# Patient Record
Sex: Female | Born: 1990 | Hispanic: No | Marital: Married | Smoking: Never smoker
Health system: Southern US, Community
[De-identification: ages and names within clinical notes are randomized; demographics above are authoritative.]

## PROBLEM LIST (undated history)

## (undated) DIAGNOSIS — Z789 Other specified health status: Secondary | ICD-10-CM

## (undated) DIAGNOSIS — O1490 Unspecified pre-eclampsia, unspecified trimester: Secondary | ICD-10-CM

## (undated) HISTORY — DX: Unspecified pre-eclampsia, unspecified trimester: O14.90

## (undated) HISTORY — DX: Other specified health status: Z78.9

---

## 2018-11-07 ENCOUNTER — Other Ambulatory Visit: Payer: Self-pay

## 2018-11-07 ENCOUNTER — Ambulatory Visit: Payer: Self-pay | Admitting: Internal Medicine

## 2018-11-07 DIAGNOSIS — N898 Other specified noninflammatory disorders of vagina: Secondary | ICD-10-CM

## 2018-11-07 NOTE — Progress Notes (Signed)
No charge visit. Patient is coming in tomorrow for a live visit to evaluate the same complaint.  Peggye Pitt, MD

## 2018-11-08 ENCOUNTER — Other Ambulatory Visit: Payer: Self-pay

## 2018-11-08 ENCOUNTER — Ambulatory Visit: Payer: Self-pay | Admitting: Internal Medicine

## 2018-11-08 ENCOUNTER — Encounter: Payer: Self-pay | Admitting: Internal Medicine

## 2018-11-08 ENCOUNTER — Ambulatory Visit (INDEPENDENT_AMBULATORY_CARE_PROVIDER_SITE_OTHER): Payer: Self-pay | Admitting: Internal Medicine

## 2018-11-08 ENCOUNTER — Other Ambulatory Visit (HOSPITAL_COMMUNITY)
Admission: RE | Admit: 2018-11-08 | Discharge: 2018-11-08 | Disposition: A | Discharge status: Home / Self Care | Payer: Self-pay | Source: Ambulatory Visit | Attending: Internal Medicine | Admitting: Internal Medicine

## 2018-11-08 VITALS — BP 102/68 | HR 106 | Temp 98.6°F | Ht 59.0 in | Wt 131.2 lb

## 2018-11-08 DIAGNOSIS — N898 Other specified noninflammatory disorders of vagina: Secondary | ICD-10-CM

## 2018-11-08 DIAGNOSIS — Z0000 Encounter for general adult medical examination without abnormal findings: Secondary | ICD-10-CM | POA: Insufficient documentation

## 2018-11-08 DIAGNOSIS — R221 Localized swelling, mass and lump, neck: Secondary | ICD-10-CM

## 2018-11-08 LAB — POCT URINE PREGNANCY: Preg Test, Ur: NEGATIVE

## 2018-11-08 NOTE — Patient Instructions (Signed)
-It nice meeting you today!  -Lab work today. Will notify you when results are available.   Preventive Care 18-39 Years, Female Preventive care refers to lifestyle choices and visits with your health care provider that can promote health and wellness. What does preventive care include?   A yearly physical exam. This is also called an annual well check.  Dental exams once or twice a year.  Routine eye exams. Ask your health care provider how often you should have your eyes checked.  Personal lifestyle choices, including: ? Daily care of your teeth and gums. ? Regular physical activity. ? Eating a healthy diet. ? Avoiding tobacco and drug use. ? Limiting alcohol use. ? Practicing safe sex. ? Taking vitamin and mineral supplements as recommended by your health care provider. What happens during an annual well check? The services and screenings done by your health care provider during your annual well check will depend on your age, overall health, lifestyle risk factors, and family history of disease. Counseling Your health care provider may ask you questions about your:  Alcohol use.  Tobacco use.  Drug use.  Emotional well-being.  Home and relationship well-being.  Sexual activity.  Eating habits.  Work and work Statistician.  Method of birth control.  Menstrual cycle.  Pregnancy history. Screening You may have the following tests or measurements:  Height, weight, and BMI.  Diabetes screening. This is done by checking your blood sugar (glucose) after you have not eaten for a while (fasting).  Blood pressure.  Lipid and cholesterol levels. These may be checked every 5 years starting at age 63.  Skin check.  Hepatitis C blood test.  Hepatitis B blood test.  Sexually transmitted disease (STD) testing.  BRCA-related cancer screening. This may be done if you have a family history of breast, ovarian, tubal, or peritoneal cancers.  Pelvic exam and Pap  test. This may be done every 3 years starting at age 20. Starting at age 67, this may be done every 5 years if you have a Pap test in combination with an HPV test. Discuss your test results, treatment options, and if necessary, the need for more tests with your health care provider. Vaccines Your health care provider may recommend certain vaccines, such as:  Influenza vaccine. This is recommended every year.  Tetanus, diphtheria, and acellular pertussis (Tdap, Td) vaccine. You may need a Td booster every 10 years.  Varicella vaccine. You may need this if you have not been vaccinated.  HPV vaccine. If you are 89 or younger, you may need three doses over 6 months.  Measles, mumps, and rubella (MMR) vaccine. You may need at least one dose of MMR. You may also need a second dose.  Pneumococcal 13-valent conjugate (PCV13) vaccine. You may need this if you have certain conditions and were not previously vaccinated.  Pneumococcal polysaccharide (PPSV23) vaccine. You may need one or two doses if you smoke cigarettes or if you have certain conditions.  Meningococcal vaccine. One dose is recommended if you are age 71-21 years and a first-year college student living in a residence hall, or if you have one of several medical conditions. You may also need additional booster doses.  Hepatitis A vaccine. You may need this if you have certain conditions or if you travel or work in places where you may be exposed to hepatitis A.  Hepatitis B vaccine. You may need this if you have certain conditions or if you travel or work in places where you may  be exposed to hepatitis B.  Haemophilus influenzae type b (Hib) vaccine. You may need this if you have certain risk factors. Talk to your health care provider about which screenings and vaccines you need and how often you need them. This information is not intended to replace advice given to you by your health care provider. Make sure you discuss any questions you  have with your health care provider. Document Released: 08/31/2001 Document Revised: 02/15/2017 Document Reviewed: 05/06/2015 Elsevier Interactive Patient Education  2019 [employer].

## 2018-11-08 NOTE — Progress Notes (Signed)
New Patient Office Visit     CC/Reason for Visit: Establish care, vaginal itching Previous PCP: none Last Visit: unknown  HPI: Heather Mcclure is a 28 y.o. female who is coming in today for the above mentioned reasons. She moved to the [place] from [place] 3 years ago. She has not had routine medical care. She has stopped using all birth control and is now having unprotected intercourse with her husband as she is trying to have a child. Her menstrual cycles are regular but have become less long, now lasting 2-3 days as opposed to 4-5 before. The vaginal itching began 2 weeks ago, she tried using OTC Vagisil without relief. She has not noticed any vaginal discharge, does not believe she is pregnant.  She is due for tetanus immunization.  She does not have routine eye or dental care.  She is due for PAP today.   Past Medical/Surgical History: History reviewed. No pertinent past medical history.  History reviewed. No pertinent surgical history.  Social History:  reports that she has never smoked. She has never used smokeless tobacco. She reports current alcohol use. She reports that she does not use drugs.  Allergies: No Known Allergies  Family History:  Family History  Problem Relation Age of Onset  . Heart disease Father     No current outpatient medications on file.  Review of Systems:  Constitutional: Denies fever, chills, diaphoresis, appetite change and fatigue.  HEENT: Denies photophobia, eye pain, redness, hearing loss, ear pain, congestion, sore throat, rhinorrhea, sneezing, mouth sores, trouble swallowing, neck pain, neck stiffness and tinnitus.   Respiratory: Denies SOB, DOE, cough, chest tightness,  and wheezing.   Cardiovascular: Denies chest pain, palpitations and leg swelling.  Gastrointestinal: Denies nausea, vomiting, abdominal pain, diarrhea, constipation, blood in stool and abdominal distention.  Genitourinary: Denies dysuria, urgency, frequency, hematuria, flank  pain and difficulty urinating.  Endocrine: Denies: hot or cold intolerance, sweats, changes in hair or nails, polyuria, polydipsia. Musculoskeletal: Denies myalgias, back pain, joint swelling, arthralgias and gait problem.  Skin: Denies pallor, rash and wound.  Neurological: Denies dizziness, seizures, syncope, weakness, light-headedness, numbness and headaches.  Hematological: Denies adenopathy. Easy bruising, personal or family bleeding history  Psychiatric/Behavioral: Denies suicidal ideation, mood changes, confusion, nervousness, sleep disturbance and agitation    Physical Exam: Vitals:   11/08/18 1524  BP: 102/68  Pulse: (!) 106  Temp: 98.6 F (37 C)  TempSrc: Oral  SpO2: 95%  Weight: 131 lb 3.2 oz (59.5 kg)  Height: _0  (1.499 m)   Body mass index is 26.5 kg/m.   Constitutional: NAD, calm, comfortable Eyes: PERRL, lids and conjunctivae normal ENMT: Mucous membranes are moist. Posterior pharynx clear of any exudate or lesions. Normal dentition. Tympanic membrane is pearly white, no erythema or bulging. Neck: normal, supple, significant neck fullness Respiratory: clear to auscultation bilaterally, no wheezing, no crackles. Normal respiratory effort. No accessory muscle use.  Cardiovascular: Regular rate and rhythm, no murmurs / rubs / gallops. No extremity edema. 2+ pedal pulses. No carotid bruits.  Abdomen: no tenderness, no masses palpated. No hepatosplenomegaly. Bowel sounds positive.  Musculoskeletal: no clubbing / cyanosis. No joint deformity upper and lower extremities. Good ROM, no contractures. Normal muscle tone.  Neurologic: CN 2-12 grossly intact. Sensation intact, DTR normal. Strength 5/5 in all 4.  Psychiatric: Normal judgment and insight. Alert and oriented x 3. Normal mood.    Impression and Plan:  Encounter for preventive health examination  -Refuses tetanus immunization today. -Advised routine  eye and dental care. -PAP performed today. -Discussed  healthy eating habits and increased physical activity.  Neck fullness  -Concerned for thyroid enlargement, especially as she is trying to conceive. -Check TSH, free T4 and free T3 today. -May need thyroid US.  Vaginal itching -Pelvic performed today, mild-moderate amounts of clear discharge noted, no redness. -Wet prep sent.     Patient Instructions  -It nice meeting you today!  -Lab work today. Will notify you when results are available.   Preventive Care 18-39 Years, Female Preventive care refers to lifestyle choices and visits with your health care provider that can promote health and wellness. What does preventive care include?   A yearly physical exam. This is also called an annual well check.  Dental exams once or twice a year.  Routine eye exams. Ask your health care provider how often you should have your eyes checked.  Personal lifestyle choices, including: ? Daily care of your teeth and gums. ? Regular physical activity. ? Eating a healthy diet. ? Avoiding tobacco and drug use. ? Limiting alcohol use. ? Practicing safe sex. ? Taking vitamin and mineral supplements as recommended by your health care provider. What happens during an annual well check? The services and screenings done by your health care provider during your annual well check will depend on your age, overall health, lifestyle risk factors, and family history of disease. Counseling Your health care provider may ask you questions about your:  Alcohol use.  Tobacco use.  Drug use.  Emotional well-being.  Home and relationship well-being.  Sexual activity.  Eating habits.  Work and work Statistician.  Method of birth control.  Menstrual cycle.  Pregnancy history. Screening You may have the following tests or measurements:  Height, weight, and BMI.  Diabetes screening. This is done by checking your blood sugar (glucose) after you have not eaten for a while (fasting).  Blood  pressure.  Lipid and cholesterol levels. These may be checked every 5 years starting at age 1.  Skin check.  Hepatitis C blood test.  Hepatitis B blood test.  Sexually transmitted disease (STD) testing.  BRCA-related cancer screening. This may be done if you have a family history of breast, ovarian, tubal, or peritoneal cancers.  Pelvic exam and Pap test. This may be done every 3 years starting at age 35. Starting at age 56, this may be done every 5 years if you have a Pap test in combination with an HPV test. Discuss your test results, treatment options, and if necessary, the need for more tests with your health care provider. Vaccines Your health care provider may recommend certain vaccines, such as:  Influenza vaccine. This is recommended every year.  Tetanus, diphtheria, and acellular pertussis (Tdap, Td) vaccine. You may need a Td booster every 10 years.  Varicella vaccine. You may need this if you have not been vaccinated.  HPV vaccine. If you are 5 or younger, you may need three doses over 6 months.  Measles, mumps, and rubella (MMR) vaccine. You may need at least one dose of MMR. You may also need a second dose.  Pneumococcal 13-valent conjugate (PCV13) vaccine. You may need this if you have certain conditions and were not previously vaccinated.  Pneumococcal polysaccharide (PPSV23) vaccine. You may need one or two doses if you smoke cigarettes or if you have certain conditions.  Meningococcal vaccine. One dose is recommended if you are age 32-21 years and a first-year college student living in a residence hall, or if  you have one of several medical conditions. You may also need additional booster doses.  Hepatitis A vaccine. You may need this if you have certain conditions or if you travel or work in places where you may be exposed to hepatitis A.  Hepatitis B vaccine. You may need this if you have certain conditions or if you travel or work in places where you may be  exposed to hepatitis B.  Haemophilus influenzae type b (Hib) vaccine. You may need this if you have certain risk factors. Talk to your health care provider about which screenings and vaccines you need and how often you need them. This information is not intended to replace advice given to you by your health care provider. Make sure you discuss any questions you have with your health care provider. Document Released: 08/31/2001 Document Revised: 02/15/2017 Document Reviewed: 05/06/2015 Elsevier Interactive Patient Education  2019 [place], [place] [facility]

## 2018-11-09 LAB — CBC WITH DIFFERENTIAL/PLATELET
Basophils Absolute: 0.1 10*3/uL (ref 0.0–0.1)
Basophils Relative: 1.2 % (ref 0.0–3.0)
Eosinophils Absolute: 0.2 10*3/uL (ref 0.0–0.7)
Eosinophils Relative: 2 % (ref 0.0–5.0)
HCT: 39.2 % (ref 36.0–46.0)
Hemoglobin: 13.3 g/dL (ref 12.0–15.0)
Lymphocytes Relative: 22.9 % (ref 12.0–46.0)
Lymphs Abs: 2.1 10*3/uL (ref 0.7–4.0)
MCHC: 33.9 g/dL (ref 30.0–36.0)
MCV: 84.6 fl (ref 78.0–100.0)
Monocytes Absolute: 0.7 10*3/uL (ref 0.1–1.0)
Monocytes Relative: 7.5 % (ref 3.0–12.0)
Neutro Abs: 6.1 10*3/uL (ref 1.4–7.7)
Neutrophils Relative %: 66.4 % (ref 43.0–77.0)
Platelets: 433 10*3/uL — ABNORMAL HIGH (ref 150.0–400.0)
RBC: 4.63 Mil/uL (ref 3.87–5.11)
RDW: 13.2 % (ref 11.5–15.5)
WBC: 9.2 10*3/uL (ref 4.0–10.5)

## 2018-11-09 LAB — COMPREHENSIVE METABOLIC PANEL
ALT: 9 U/L (ref 0–35)
AST: 17 U/L (ref 0–37)
Albumin: 4.6 g/dL (ref 3.5–5.2)
Alkaline Phosphatase: 70 U/L (ref 39–117)
BUN: 7 mg/dL (ref 6–23)
CO2: 28 mEq/L (ref 19–32)
Calcium: 9.6 mg/dL (ref 8.4–10.5)
Chloride: 102 mEq/L (ref 96–112)
Creatinine, Ser: 0.53 mg/dL (ref 0.40–1.20)
GFR: 136.44 mL/min (ref >60.00)
Glucose, Bld: 79 mg/dL (ref 70–99)
Potassium: 4 mEq/L (ref 3.5–5.1)
Sodium: 138 mEq/L (ref 135–145)
Total Bilirubin: 0.3 mg/dL (ref 0.2–1.2)
Total Protein: 7.3 g/dL (ref 6.0–8.3)

## 2018-11-09 LAB — T4, FREE: Free T4: 0.81 ng/dL (ref 0.60–1.60)

## 2018-11-09 LAB — VITAMIN D 25 HYDROXY (VIT D DEFICIENCY, FRACTURES): VITD: 20.05 ng/mL — ABNORMAL LOW (ref 30.00–100.00)

## 2018-11-09 LAB — T3, FREE: T3, Free: 3.5 pg/mL (ref 2.3–4.2)

## 2018-11-09 LAB — TSH: TSH: 1.91 u[IU]/mL (ref 0.35–4.50)

## 2018-11-10 ENCOUNTER — Encounter: Payer: Self-pay | Admitting: Internal Medicine

## 2018-11-10 ENCOUNTER — Other Ambulatory Visit: Payer: Self-pay | Admitting: Internal Medicine

## 2018-11-10 ENCOUNTER — Telehealth: Payer: Self-pay | Admitting: *Deleted

## 2018-11-10 DIAGNOSIS — E559 Vitamin D deficiency, unspecified: Secondary | ICD-10-CM

## 2018-11-10 DIAGNOSIS — R221 Localized swelling, mass and lump, neck: Secondary | ICD-10-CM

## 2018-11-10 MED ORDER — VITAMIN D (ERGOCALCIFEROL) 1.25 MG (50000 UNIT) PO CAPS: 50000.0000 [IU] | ORAL_CAPSULE | ORAL | 0 refills | Status: DC

## 2018-11-10 NOTE — Telephone Encounter (Signed)
Patient called in verbalizing she got a call from her pharmacy informing her rx is ready for pick up. Clinic RN informed patient per Dr. Ardyth Harps labs are only significant for Vit D def. Will send Rx for vit D 50,000 IU weekly for 12 weeks, will need vit D levels rechecked at that point. Thyroid labs are ok, but will send for thyroid US given significant neck fullness noted on exam. Patient verbalized understanding. Future lab order placed for Vit D. Patient verbalized she will call back to schedule lab appointment.

## 2018-11-14 ENCOUNTER — Other Ambulatory Visit: Payer: Self-pay | Admitting: Internal Medicine

## 2018-11-14 DIAGNOSIS — B373 Candidiasis of vulva and vagina: Secondary | ICD-10-CM

## 2018-11-14 DIAGNOSIS — B3731 Acute candidiasis of vulva and vagina: Secondary | ICD-10-CM

## 2018-11-14 LAB — CYTOLOGY - PAP
Adequacy: ABSENT
Chlamydia: NEGATIVE
Diagnosis: NEGATIVE
Neisseria Gonorrhea: NEGATIVE
Trichomonas: NEGATIVE

## 2018-11-14 MED ORDER — FLUCONAZOLE 150 MG PO TABS: 150.0000 mg | ORAL_TABLET | Freq: Every day | ORAL | 0 refills | Status: AC

## 2019-01-26 ENCOUNTER — Other Ambulatory Visit: Payer: Self-pay | Admitting: Internal Medicine

## 2019-01-26 DIAGNOSIS — E559 Vitamin D deficiency, unspecified: Secondary | ICD-10-CM

## 2019-04-11 ENCOUNTER — Other Ambulatory Visit: Payer: Self-pay

## 2019-04-11 DIAGNOSIS — Z20822 Contact with and (suspected) exposure to covid-19: Secondary | ICD-10-CM

## 2019-04-13 LAB — NOVEL CORONAVIRUS, NAA: SARS-CoV-2, NAA: NOT DETECTED

## 2019-07-16 ENCOUNTER — Ambulatory Visit: Payer: Self-pay | Attending: Internal Medicine

## 2019-07-16 DIAGNOSIS — Z20822 Contact with and (suspected) exposure to covid-19: Secondary | ICD-10-CM

## 2019-07-18 ENCOUNTER — Other Ambulatory Visit: Payer: Self-pay

## 2019-07-18 ENCOUNTER — Encounter (HOSPITAL_COMMUNITY): Payer: Self-pay | Admitting: Obstetrics and Gynecology

## 2019-07-18 ENCOUNTER — Other Ambulatory Visit: Payer: Self-pay | Admitting: Obstetrics and Gynecology

## 2019-07-18 ENCOUNTER — Inpatient Hospital Stay (HOSPITAL_COMMUNITY)
Admission: AD | Admit: 2019-07-18 | Discharge: 2019-07-18 | Disposition: A | Discharge status: Home / Self Care | Payer: Self-pay | Attending: Obstetrics and Gynecology | Admitting: Obstetrics and Gynecology

## 2019-07-18 DIAGNOSIS — Z3A00 Weeks of gestation of pregnancy not specified: Secondary | ICD-10-CM | POA: Insufficient documentation

## 2019-07-18 DIAGNOSIS — O009 Unspecified ectopic pregnancy without intrauterine pregnancy: Secondary | ICD-10-CM | POA: Insufficient documentation

## 2019-07-18 LAB — CBC WITH DIFFERENTIAL/PLATELET
Abs Immature Granulocytes: 0.03 10*3/uL (ref 0.00–0.07)
Basophils Absolute: 0.1 10*3/uL (ref 0.0–0.1)
Basophils Relative: 1 %
Eosinophils Absolute: 0.1 10*3/uL (ref 0.0–0.5)
Eosinophils Relative: 1 %
HCT: 38.6 % (ref 36.0–46.0)
Hemoglobin: 13.1 g/dL (ref 12.0–15.0)
Immature Granulocytes: 0 %
Lymphocytes Relative: 18 %
Lymphs Abs: 1.9 10*3/uL (ref 0.7–4.0)
MCH: 28.6 pg (ref 26.0–34.0)
MCHC: 33.9 g/dL (ref 30.0–36.0)
MCV: 84.3 fL (ref 80.0–100.0)
Monocytes Absolute: 0.8 10*3/uL (ref 0.1–1.0)
Monocytes Relative: 7 %
Neutro Abs: 7.6 10*3/uL (ref 1.7–7.7)
Neutrophils Relative %: 73 %
Platelets: 448 10*3/uL — ABNORMAL HIGH (ref 150–400)
RBC: 4.58 MIL/uL (ref 3.87–5.11)
RDW: 12.3 % (ref 11.5–15.5)
WBC: 10.6 10*3/uL — ABNORMAL HIGH (ref 4.0–10.5)
nRBC: 0 % (ref 0.0–0.2)

## 2019-07-18 LAB — COMPREHENSIVE METABOLIC PANEL
ALT: 11 U/L (ref 0–44)
AST: 19 U/L (ref 15–41)
Albumin: 4.2 g/dL (ref 3.5–5.0)
Alkaline Phosphatase: 59 U/L (ref 38–126)
Anion gap: 10 (ref 5–15)
BUN: <5 mg/dL — ABNORMAL LOW (ref 6–20)
CO2: 22 mmol/L (ref 22–32)
Calcium: 9.6 mg/dL (ref 8.9–10.3)
Chloride: 106 mmol/L (ref 98–111)
Creatinine, Ser: 0.48 mg/dL (ref 0.44–1.00)
GFR calc Af Amer: >60 mL/min (ref >60)
GFR calc non Af Amer: >60 mL/min (ref >60)
Glucose, Bld: 98 mg/dL (ref 70–99)
Potassium: 3.5 mmol/L (ref 3.5–5.1)
Sodium: 138 mmol/L (ref 135–145)
Total Bilirubin: 0.1 mg/dL — ABNORMAL LOW (ref 0.3–1.2)
Total Protein: 7.3 g/dL (ref 6.5–8.1)

## 2019-07-18 LAB — NOVEL CORONAVIRUS, NAA: SARS-CoV-2, NAA: NOT DETECTED

## 2019-07-18 MED ORDER — METHOTREXATE FOR ECTOPIC PREGNANCY
50.0000 mg/m2 | Freq: Once | INTRAMUSCULAR | Status: AC
  Administered 2019-07-18: 75 mg via INTRAMUSCULAR
  Filled 2019-07-18: qty 1

## 2019-07-18 NOTE — MAU Note (Addendum)
Signs and symptoms to report such as severe abd pain and or heavy bleeding reviewed. No ibuprofen and stop Prenatal vitamins. May take Tylenol. To return Saturday early afternoon for repeat BHCG or sooner for any concerns. Ectopic Pregnancy sheet with precautions given to pt and reviewed. Pt voices understanding

## 2019-07-18 NOTE — MAU Note (Signed)
.  Heather Mcclure is a 28 y.o. at [redacted]w[redacted]d here in MAU reporting: that she was sent from office for Methotrexate for ectopic pregnancy. Scant amount of vaginal bleeding denies pain LMP: 06/12/19 Onset of complaint:  Pain score:  There were no vitals filed for this visit.   FHT: Lab orders placed from triage: CMP/CBC

## 2019-07-18 NOTE — Progress Notes (Signed)
Dr Murrell Redden called and given lab results. Order in for MTX as well as sign and held order for [facility] for Day #4.

## 2019-07-21 ENCOUNTER — Inpatient Hospital Stay (HOSPITAL_COMMUNITY)
Admission: AD | Admit: 2019-07-21 | Discharge: 2019-07-21 | Disposition: A | Discharge status: Home / Self Care | Payer: 59 | Attending: Obstetrics | Admitting: Obstetrics

## 2019-07-21 ENCOUNTER — Other Ambulatory Visit: Payer: Self-pay

## 2019-07-21 ENCOUNTER — Encounter (HOSPITAL_COMMUNITY): Payer: Self-pay | Admitting: Obstetrics

## 2019-07-21 DIAGNOSIS — O009 Unspecified ectopic pregnancy without intrauterine pregnancy: Secondary | ICD-10-CM | POA: Insufficient documentation

## 2019-07-21 DIAGNOSIS — Z3A00 Weeks of gestation of pregnancy not specified: Secondary | ICD-10-CM | POA: Insufficient documentation

## 2019-07-21 DIAGNOSIS — O00109 Unspecified tubal pregnancy without intrauterine pregnancy: Secondary | ICD-10-CM | POA: Diagnosis not present

## 2019-07-21 LAB — HCG, QUANTITATIVE, PREGNANCY: hCG, Beta Chain, Quant, S: 1925 m[IU]/mL — ABNORMAL HIGH (ref <5)

## 2019-07-21 NOTE — Discharge Instructions (Signed)
Methotrexate Treatment for an Ectopic Pregnancy, Care After This sheet gives you information about how to care for yourself after your procedure. Your health care provider may also give you more specific instructions. If you have problems or questions, contact your health care provider. What can I expect after the procedure? After the procedure, it is common to have:  Abdominal cramping.  Vaginal bleeding.  Fatigue.  Nausea.  Vomiting.  Diarrhea. Blood tests will be taken at timed intervals for several days or weeks to check your pregnancy hormone levels. The blood tests will be done until the pregnancy hormone can no longer be detected in the blood. Follow these instructions at home: Activity  Do not have sex until your health care provider approves.  Limit activities that take a lot of effort as told by your health care provider. Medicines  Take over the counter and prescription medicines only as told by your health care provider.  Do not take aspirin, ibuprofen, naproxen, or any other NSAIDs.  Do not take folic acid, prenatal vitamins, or other vitamins that contain folic acid. General instructions   Do not drink alcohol.  Follow instructions from your health care provider on how and when to report any symptoms that may indicate a ruptured ectopic pregnancy.  Keep all follow-up visits as told by your health care provider. This is important. Contact a health care provider if:  You have persistent nausea and vomiting.  You have persistent diarrhea.  You are having a reaction to the medicine, such as: ? Tiredness. ? Skin rash. ? Hair loss. Get help right away if:  Your abdominal or pelvic pain gets worse.  You have more vaginal bleeding.  You feel light-headed or you faint.  You have shortness of breath.  Your heart rate increases.  You develop a cough.  You have chills.  You have a fever. Summary  After the procedure, it is common to have symptoms  of abdominal cramping, vaginal bleeding and fatigue. You may also experience other symptoms.  Blood tests will be taken at timed intervals for several days or weeks to check your pregnancy hormone levels. The blood tests will be done until the pregnancy hormone can no longer be detected in the blood.  Limit strenuous activity as told by your health care provider.  Follow instructions from your health care provider on how and when to report any symptoms that may indicate a ruptured ectopic pregnancy. This information is not intended to replace advice given to you by your health care provider. Make sure you discuss any questions you have with your health care provider. Document Revised: 06/17/2017 Document Reviewed: 08/24/2016 Elsevier Patient Education  2020 [employer] Inc[employer]  

## 2019-07-21 NOTE — MAU Note (Signed)
Contacted Dr Ernestina Penna and she states that patient can leave after blood is drawn and she will put in a discharge order. Dr Ernestina Penna is unable to come perform MSE and is requesting MAU provider to do this.

## 2019-07-21 NOTE — MAU Note (Signed)
Heather Mcclure is a 29 y.o. at [redacted]w[redacted]d here in MAU reporting: day 4 post MTX, here for follow up hcg. Denies bleeding but is having some abdominal pain, states it is mild.  Pain score: 1/10  Vitals:   07/21/19 1258  BP: 112/62  Pulse: 83  Resp: 16  Temp: 98.5 F (36.9 C)  SpO2: 100%     Lab orders placed from triage: hcg

## 2019-07-21 NOTE — MAU Provider Note (Signed)
Chief Complaint: Ectopic Pregnancy   First Provider Initiated Contact with Patient 07/21/19 1322      SUBJECTIVE HPI: [name] is a 29 y.o. G1P0 who presents to MAU for day 4 after MTX. She denies any abdominal pain or vaginal bleeding.    History reviewed. No pertinent past medical history. History reviewed. No pertinent surgical history. Social History   Socioeconomic History  . Marital status: Married    Spouse name: Not on file  . Number of children: Not on file  . Years of education: Not on file  . Highest education level: Not on file  Occupational History  . Not on file  Tobacco Use  . Smoking status: Never Smoker  . Smokeless tobacco: Never Used  Substance and Sexual Activity  . Alcohol use: Never  . Drug use: Never  . Sexual activity: Yes    Partners: Male    Birth control/protection: None  Other Topics Concern  . Not on file  Social History Narrative  . Not on file   Social Determinants of Health   Financial Resource Strain:   . Difficulty of Paying Living Expenses: Not on file  Food Insecurity:   . Worried About Charity fundraiser in the Last Year: Not on file  . Ran Out of Food in the Last Year: Not on file  Transportation Needs:   . Lack of Transportation (Medical): Not on file  . Lack of Transportation (Non-Medical): Not on file  Physical Activity:   . Days of Exercise per Week: Not on file  . Minutes of Exercise per Session: Not on file  Stress:   . Feeling of Stress : Not on file  Social Connections:   . Frequency of Communication with Friends and Family: Not on file  . Frequency of Social Gatherings with Friends and Family: Not on file  . Attends Religious Services: Not on file  . Active Member of Clubs or Organizations: Not on file  . Attends Archivist Meetings: Not on file  . Marital Status: Not on file  Intimate Partner Violence:   . Fear of Current or Ex-Partner: Not on file  . Emotionally Abused: Not on file  .  Physically Abused: Not on file  . Sexually Abused: Not on file   No current facility-administered medications on file prior to encounter.   No current outpatient medications on file prior to encounter.   No Known Allergies  ROS:  Review of Systems  Constitutional: Negative.   HENT: Negative.   Eyes: Negative.   Respiratory: Negative.   Cardiovascular: Negative.   Gastrointestinal: Negative.   Endocrine: Negative.   Genitourinary: Negative.   Musculoskeletal: Negative.   Skin: Negative.   Allergic/Immunologic: Negative.   Neurological: Negative.   Hematological: Negative.   Psychiatric/Behavioral: Negative.     I have reviewed patient's Past Medical Hx, Surgical Hx, Family Hx, Social Hx, medications and allergies.   Physical Exam   Patient Vitals for the past 24 hrs:  BP Temp Temp src Pulse Resp SpO2  07/21/19 1258 112/62 98.5 F (36.9 C) Oral 83 16 100 %   Physical Exam  Nursing note and vitals reviewed. Constitutional: She is oriented to person, place, and time. She appears well-developed and well-nourished.  HENT:  Head: Normocephalic and atraumatic.  Eyes: Pupils are equal, round, and reactive to light.  Cardiovascular: Normal rate.  Respiratory: Effort normal.  GI: Soft.  Genitourinary:    Genitourinary Comments: Not indicated   Musculoskeletal:  General: Normal range of motion.     Cervical back: Normal range of motion.  Neurological: She is alert and oriented to person, place, and time.  Psychiatric: She has a normal mood and affect. Her behavior is normal. Judgment and thought content normal.    MDM Patient denies any concerning symptoms in need of emergent evaluation. Patient stable for discharge home. Patient reminded of reasons ASSESSMENT MSE Complete  PLAN Discharge patient Follow-up with [facility] on Tuesday 07/24/2019  Raelyn Mora, CNM 07/21/2019 1:22 PM

## 2019-07-31 ENCOUNTER — Other Ambulatory Visit: Payer: Self-pay

## 2019-07-31 ENCOUNTER — Other Ambulatory Visit: Payer: Self-pay | Admitting: Obstetrics & Gynecology

## 2019-07-31 ENCOUNTER — Inpatient Hospital Stay (HOSPITAL_COMMUNITY)
Admission: AD | Admit: 2019-07-31 | Discharge: 2019-07-31 | Disposition: A | Discharge status: Home / Self Care | Payer: 59 | Attending: Obstetrics & Gynecology | Admitting: Obstetrics & Gynecology

## 2019-07-31 ENCOUNTER — Inpatient Hospital Stay (HOSPITAL_COMMUNITY): Payer: 59

## 2019-07-31 ENCOUNTER — Encounter (HOSPITAL_COMMUNITY): Payer: Self-pay | Admitting: Obstetrics & Gynecology

## 2019-07-31 DIAGNOSIS — O00101 Right tubal pregnancy without intrauterine pregnancy: Secondary | ICD-10-CM

## 2019-07-31 DIAGNOSIS — Z6790 Unspecified blood type, Rh positive: Secondary | ICD-10-CM

## 2019-07-31 DIAGNOSIS — O3680X0 Pregnancy with inconclusive fetal viability, not applicable or unspecified: Secondary | ICD-10-CM

## 2019-07-31 LAB — URINALYSIS, ROUTINE W REFLEX MICROSCOPIC
Bilirubin Urine: NEGATIVE
Glucose, UA: NEGATIVE mg/dL
Hgb urine dipstick: NEGATIVE
Ketones, ur: NEGATIVE mg/dL
Leukocytes,Ua: NEGATIVE
Nitrite: NEGATIVE
Protein, ur: NEGATIVE mg/dL
Specific Gravity, Urine: 1.011 (ref 1.005–1.030)
pH: 7 (ref 5.0–8.0)

## 2019-07-31 MED ORDER — METHOTREXATE FOR ECTOPIC PREGNANCY
50.0000 mg/m2 | Freq: Once | INTRAMUSCULAR | Status: AC
  Administered 2019-07-31: 75 mg via INTRAMUSCULAR
  Filled 2019-07-31: qty 1

## 2019-07-31 NOTE — MAU Provider Note (Signed)
History     CSN: [id number]  Arrival date and time: 07/31/19 1207   First Provider Initiated Contact with Patient 07/31/19 1245      Chief Complaint  Patient presents with  . Ectopic Pregnancy   29 y.o. G1 with known right ectopic pregnancy s/p MTX sent from office for further evaluation after qhcg rise. Pt denies abdominal pain or bleeding/spotting. No fevers.   OB History    Gravida  1   Para      Term      Preterm      AB      Living        SAB      TAB      Ectopic      Multiple      Live Births              History reviewed. No pertinent past medical history.  History reviewed. No pertinent surgical history.  Family History  Problem Relation Age of Onset  . Heart disease Father     Social History   Tobacco Use  . Smoking status: Never Smoker  . Smokeless tobacco: Never Used  Substance Use Topics  . Alcohol use: Never  . Drug use: Never    Allergies: No Known Allergies  No medications prior to admission.    Review of Systems  Constitutional: Negative for fever.  Gastrointestinal: Negative for abdominal pain.  Genitourinary: Negative for vaginal bleeding.   Physical Exam   Blood pressure 125/70, pulse 85, temperature 98.5 F (36.9 C), temperature source Oral, resp. rate 18, height 4' 11"  (1.499 m), weight 56.4 kg, last menstrual period 06/12/2019.  Physical Exam  Nursing note and vitals reviewed. Constitutional: She is oriented to person, place, and time. She appears well-developed and well-nourished. No distress.  HENT:  Head: Normocephalic and atraumatic.  Cardiovascular: Normal rate.  Respiratory: Effort normal. No respiratory distress.  Musculoskeletal:        General: Normal range of motion.     Cervical back: Normal range of motion.  Neurological: She is alert and oriented to person, place, and time.  Psychiatric: She has a normal mood and affect.   Results for orders placed or performed during the hospital encounter  of 07/31/19 (from the past 24 hour(s))  Urinalysis, Routine w reflex microscopic     Status: Abnormal   Collection Time: 07/31/19  1:09 PM  Result Value Ref Range   Color, Urine YELLOW YELLOW   APPearance HAZY (A) CLEAR   Specific Gravity, Urine 1.011 1.005 - 1.030   pH 7.0 5.0 - 8.0   Glucose, UA NEGATIVE NEGATIVE mg/dL   Hgb urine dipstick NEGATIVE NEGATIVE   Bilirubin Urine NEGATIVE NEGATIVE   Ketones, ur NEGATIVE NEGATIVE mg/dL   Protein, ur NEGATIVE NEGATIVE mg/dL   Nitrite NEGATIVE NEGATIVE   Leukocytes,Ua NEGATIVE NEGATIVE    US OB LESS THAN 14 WEEKS WITH OB TRANSVAGINAL  Result Date: 07/31/2019 CLINICAL DATA:  Followup ectopic EXAM: OBSTETRIC <14 WK [place] AND TRANSVAGINAL OB US TECHNIQUE: Both transabdominal and transvaginal ultrasound examinations were performed for complete evaluation of the gestation as well as the maternal uterus, adnexal regions, and pelvic cul-de-sac. Transvaginal technique was performed to assess early pregnancy. COMPARISON:  None. FINDINGS: Intrauterine gestational sac: None Yolk sac:  Not Visualized. Embryo:  Not Visualized. Cardiac Activity: Not Visualized. Subchorionic hemorrhage:  None visualized. Maternal uterus/adnexae: Right ovary: Normal measuring 2.3 x 1.2 x 3.2 cm Left ovary: Normal measuring 2.6 x 2.3 x  1.5 cm Other :Heterogeneous mass within the right adnexa is identified measuring 1.9 x 1.6 x 1.6 cm. This appears separate from the right ovary. Free fluid:  Trace IMPRESSION: 1. No intrauterine gestation identified. 2. Solid mass within the right adnexa is identified. This is separate from the right ovary and is concerning for ectopic pregnancy. 3. Critical Value/emergent results were called by telephone at the time of interpretation on 07/31/2019 at 2:11 pm to Jennings, who verbally acknowledged these results. Electronically Signed   By: Kerby Moors M.D.   On: 07/31/2019 14:12   MAU Course  Procedures Meds ordered this encounter   Medications  . methotrexate [facility]) chemo injection kit 75 mg   MDM Previous labs and [place] reviewed and confirmed with Dr. Benjie Karvonen 07/16/19: qhcg 318 07/18/19: qhcg 623 and US showed Rt adnexal mass measuring 0.7x0.6x0.6 and no IUP (obtained at office); pt sent MAU for MTX 07/21/19: day 4 qhcg 1925 07/24/19: day 7 qhcg 1095 and US showed Rt adnexal mass 1.3x1.2x1 and CL on left (obtained at office) 07/31/19: qhcg 9865  1686: consult with Dr. Ilda Basset, reviewed previous labs and clinical findings including todays [place], would recommend consult with primary OB to offer mngt options.  1425: Called Dr. Benjie Karvonen but she's unavailable in OR at this time, message to call back. 1520: Dr. Benjie Karvonen in to counsel patient. MTX ordered at request of Dr. Benjie Karvonen.  Assessment and Plan   1. Right tubal pregnancy without intrauterine pregnancy   2. Pregnancy, location unknown   3. Blood type, Rh positive    Discharge home Follow up at [facility] on 08/03/19- message left at office Return precautions  Allergies as of 07/31/2019   No Known Allergies     Medication List    You have not been prescribed any medications.    Julianne Handler, CNM 07/31/2019, 2:30 PM

## 2019-07-31 NOTE — Progress Notes (Signed)
Pt seen in MAU after CNM Bhambri called with ultrasound report. Pt was send from office after D14 quant went up. D7 was 1095 was appropriate from from D4 in MAU of 1900 but now a little over 1200 on D14, but no pain or bleeding.   Sono-  1. No intrauterine gestation identified. 2. Solid mass within the right adnexa is identified. 1.9x1.6x1.6 cm. This is separate from the right ovary and is concerning for ectopic Pregnancy. No free fluid  Reviewed options- Laparoscopic salpingectomy (poor outcomes with fertility following salpingostomy discussed) vs salpingectomy (preferred) discussed and risks/ complications but definite treatment with minimal follow up reviewed Alternatively, Methotrexate 2nd dose and assess if responds well then avoid surgery, if doesn't drop bHCG as desired, then plan surgery but risk wait and watch and risk of rupture  After counseling her and husband was pn the phone, plan was repeat Methotrexate.  MTX in MAU, plan D4 labs in office on 1/ 15 and 1/18 Precautions reviewed CNM Denyse Amass advised agrees  --V.Juliene Pina , MD

## 2019-07-31 NOTE — Discharge Instructions (Signed)
Methotrexate Treatment for an Ectopic Pregnancy, Care After This sheet gives you information about how to care for yourself after your procedure. Your health care provider may also give you more specific instructions. If you have problems or questions, contact your health care provider. What can I expect after the procedure? After the procedure, it is common to have:  Abdominal cramping.  Vaginal bleeding.  Fatigue.  Nausea.  Vomiting.  Diarrhea. Blood tests will be taken at timed intervals for several days or weeks to check your pregnancy hormone levels. The blood tests will be done until the pregnancy hormone can no longer be detected in the blood. Follow these instructions at home: Activity  Do not have sex until your health care provider approves.  Limit activities that take a lot of effort as told by your health care provider. Medicines  Take over the counter and prescription medicines only as told by your health care provider.  Do not take aspirin, ibuprofen, naproxen, or any other NSAIDs.  Do not take folic acid, prenatal vitamins, or other vitamins that contain folic acid. General instructions   Do not drink alcohol.  Follow instructions from your health care provider on how and when to report any symptoms that may indicate a ruptured ectopic pregnancy.  Keep all follow-up visits as told by your health care provider. This is important. Contact a health care provider if:  You have persistent nausea and vomiting.  You have persistent diarrhea.  You are having a reaction to the medicine, such as: ? Tiredness. ? Skin rash. ? Hair loss. Get help right away if:  Your abdominal or pelvic pain gets worse.  You have more vaginal bleeding.  You feel light-headed or you faint.  You have shortness of breath.  Your heart rate increases.  You develop a cough.  You have chills.  You have a fever. Summary  After the procedure, it is common to have symptoms  of abdominal cramping, vaginal bleeding and fatigue. You may also experience other symptoms.  Blood tests will be taken at timed intervals for several days or weeks to check your pregnancy hormone levels. The blood tests will be done until the pregnancy hormone can no longer be detected in the blood.  Limit strenuous activity as told by your health care provider.  Follow instructions from your health care provider on how and when to report any symptoms that may indicate a ruptured ectopic pregnancy. This information is not intended to replace advice given to you by your health care provider. Make sure you discuss any questions you have with your health care provider. Document Revised: 06/17/2017 Document Reviewed: 08/24/2016 Elsevier Patient Education  2020 [employer] Inc[employer]  

## 2019-07-31 NOTE — MAU Note (Signed)
.   Heather Mcclure is a 29 y.o. at [redacted]w[redacted]d here in MAU reporting: She was seen at [facility] today. She states that she has an ectopic pregnancy and has already received x1 dose of MTX on 12/30. Her quant is rising and they are requesting that she has another [place] and possibly a second dose of MTX. Patient reports no bleeding or pain.   Vitals:   07/31/19 1227  BP: 125/70  Pulse: 85  Resp: 18  Temp: 98.5 F (36.9 C)

## 2019-08-06 ENCOUNTER — Other Ambulatory Visit (HOSPITAL_BASED_OUTPATIENT_CLINIC_OR_DEPARTMENT_OTHER): Payer: Self-pay | Admitting: Obstetrics & Gynecology

## 2019-08-06 ENCOUNTER — Ambulatory Visit (HOSPITAL_BASED_OUTPATIENT_CLINIC_OR_DEPARTMENT_OTHER): Payer: 59 | Admitting: Anesthesiology

## 2019-08-06 ENCOUNTER — Other Ambulatory Visit (HOSPITAL_COMMUNITY)
Admission: RE | Admit: 2019-08-06 | Discharge: 2019-08-06 | Disposition: A | Discharge status: Home / Self Care | Payer: 59 | Source: Ambulatory Visit | Attending: Obstetrics & Gynecology | Admitting: Obstetrics & Gynecology

## 2019-08-06 ENCOUNTER — Ambulatory Visit (HOSPITAL_BASED_OUTPATIENT_CLINIC_OR_DEPARTMENT_OTHER)
Admission: EM | Admit: 2019-08-06 | Discharge: 2019-08-06 | Disposition: A | Discharge status: Home / Self Care | Payer: 59 | Source: Ambulatory Visit | Attending: Obstetrics & Gynecology | Admitting: Obstetrics & Gynecology

## 2019-08-06 ENCOUNTER — Other Ambulatory Visit: Payer: Self-pay | Admitting: Obstetrics & Gynecology

## 2019-08-06 ENCOUNTER — Encounter (HOSPITAL_BASED_OUTPATIENT_CLINIC_OR_DEPARTMENT_OTHER): Payer: Self-pay | Admitting: Obstetrics & Gynecology

## 2019-08-06 ENCOUNTER — Encounter (HOSPITAL_BASED_OUTPATIENT_CLINIC_OR_DEPARTMENT_OTHER)
Admission: EM | Disposition: A | Discharge status: Home / Self Care | Payer: Self-pay | Source: Ambulatory Visit | Attending: Obstetrics & Gynecology

## 2019-08-06 DIAGNOSIS — E282 Polycystic ovarian syndrome: Secondary | ICD-10-CM | POA: Diagnosis not present

## 2019-08-06 DIAGNOSIS — O009 Unspecified ectopic pregnancy without intrauterine pregnancy: Secondary | ICD-10-CM | POA: Insufficient documentation

## 2019-08-06 HISTORY — PX: CHROMOPERTUBATION: SHX6288

## 2019-08-06 HISTORY — PX: LAPAROSCOPIC UNILATERAL SALPINGECTOMY: SHX5934

## 2019-08-06 LAB — CBC
HCT: 39.1 % (ref 36.0–46.0)
Hemoglobin: 13.1 g/dL (ref 12.0–15.0)
MCH: 29 pg (ref 26.0–34.0)
MCHC: 33.5 g/dL (ref 30.0–36.0)
MCV: 86.5 fL (ref 80.0–100.0)
Platelets: 372 10*3/uL (ref 150–400)
RBC: 4.52 MIL/uL (ref 3.87–5.11)
RDW: 12.9 % (ref 11.5–15.5)
WBC: 8.5 10*3/uL (ref 4.0–10.5)
nRBC: 0 % (ref 0.0–0.2)

## 2019-08-06 LAB — BASIC METABOLIC PANEL
Anion gap: 10 (ref 5–15)
BUN: <5 mg/dL — ABNORMAL LOW (ref 6–20)
CO2: 21 mmol/L — ABNORMAL LOW (ref 22–32)
Calcium: 9.4 mg/dL (ref 8.9–10.3)
Chloride: 107 mmol/L (ref 98–111)
Creatinine, Ser: 0.51 mg/dL (ref 0.44–1.00)
GFR calc Af Amer: >60 mL/min (ref >60)
GFR calc non Af Amer: >60 mL/min (ref >60)
Glucose, Bld: 93 mg/dL (ref 70–99)
Potassium: 4.1 mmol/L (ref 3.5–5.1)
Sodium: 138 mmol/L (ref 135–145)

## 2019-08-06 LAB — RESPIRATORY PANEL BY RT PCR (FLU A&B, COVID)
Influenza A by PCR: NEGATIVE
Influenza B by PCR: NEGATIVE
SARS Coronavirus 2 by RT PCR: NEGATIVE

## 2019-08-06 LAB — TYPE AND SCREEN
ABO/RH(D): A POS
Antibody Screen: NEGATIVE

## 2019-08-06 LAB — ABO/RH: ABO/RH(D): A POS

## 2019-08-06 SURGERY — SALPINGECTOMY, UNILATERAL, LAPAROSCOPIC
Anesthesia: General | Laterality: Right

## 2019-08-06 MED ORDER — MIDAZOLAM HCL 2 MG/2ML IJ SOLN
INTRAMUSCULAR | Status: AC
  Filled 2019-08-06: qty 2

## 2019-08-06 MED ORDER — DEXAMETHASONE SODIUM PHOSPHATE 10 MG/ML IJ SOLN
INTRAMUSCULAR | Status: AC
  Filled 2019-08-06: qty 1

## 2019-08-06 MED ORDER — IBUPROFEN 200 MG PO TABS: 600.0000 mg | ORAL_TABLET | Freq: Four times a day (QID) | ORAL | 0 refills | Status: DC | PRN

## 2019-08-06 MED ORDER — METHYLENE BLUE 0.5 % INJ SOLN
INTRAVENOUS | Status: DC | PRN
  Administered 2019-08-06: 8 mL

## 2019-08-06 MED ORDER — SUGAMMADEX SODIUM 200 MG/2ML IV SOLN
INTRAVENOUS | Status: DC | PRN
  Administered 2019-08-06: 200 mg via INTRAVENOUS

## 2019-08-06 MED ORDER — FENTANYL CITRATE (PF) 100 MCG/2ML IJ SOLN
INTRAMUSCULAR | Status: AC
  Filled 2019-08-06: qty 2

## 2019-08-06 MED ORDER — KETOROLAC TROMETHAMINE 30 MG/ML IJ SOLN
30.0000 mg | Freq: Once | INTRAMUSCULAR | Status: DC | PRN
  Filled 2019-08-06: qty 1

## 2019-08-06 MED ORDER — OXYCODONE HCL 5 MG/5ML PO SOLN
5.0000 mg | Freq: Once | ORAL | Status: DC | PRN
  Filled 2019-08-06: qty 5

## 2019-08-06 MED ORDER — HYDROMORPHONE HCL 1 MG/ML IJ SOLN
INTRAMUSCULAR | Status: AC
  Filled 2019-08-06: qty 1

## 2019-08-06 MED ORDER — SCOPOLAMINE 1 MG/3DAYS TD PT72
MEDICATED_PATCH | TRANSDERMAL | Status: AC
  Filled 2019-08-06: qty 1

## 2019-08-06 MED ORDER — ACETAMINOPHEN 500 MG PO TABS: 500.0000 mg | ORAL_TABLET | Freq: Four times a day (QID) | ORAL | 0 refills | Status: AC | PRN

## 2019-08-06 MED ORDER — ACETAMINOPHEN 500 MG PO TABS
1000.0000 mg | ORAL_TABLET | Freq: Once | ORAL | Status: AC
  Administered 2019-08-06: 1000 mg via ORAL
  Filled 2019-08-06: qty 2

## 2019-08-06 MED ORDER — SUGAMMADEX SODIUM 200 MG/2ML IV SOLN: INTRAVENOUS | Status: DC | PRN

## 2019-08-06 MED ORDER — PROMETHAZINE HCL 12.5 MG PO TABS: 12.5000 mg | ORAL_TABLET | Freq: Four times a day (QID) | ORAL | 0 refills | Status: DC | PRN

## 2019-08-06 MED ORDER — PHENYLEPHRINE 40 MCG/ML (10ML) SYRINGE FOR IV PUSH (FOR BLOOD PRESSURE SUPPORT)
PREFILLED_SYRINGE | INTRAVENOUS | Status: DC | PRN
  Administered 2019-08-06: 80 ug via INTRAVENOUS

## 2019-08-06 MED ORDER — LIDOCAINE 2% (20 MG/ML) 5 ML SYRINGE
INTRAMUSCULAR | Status: DC | PRN
  Administered 2019-08-06: 60 mg via INTRAVENOUS

## 2019-08-06 MED ORDER — HYDROMORPHONE HCL 1 MG/ML IJ SOLN
0.2500 mg | INTRAMUSCULAR | Status: DC | PRN
  Administered 2019-08-06 (×4): 0.5 mg via INTRAVENOUS
  Filled 2019-08-06: qty 0.5

## 2019-08-06 MED ORDER — MIDAZOLAM HCL 5 MG/5ML IJ SOLN
INTRAMUSCULAR | Status: DC | PRN
  Administered 2019-08-06: 2 mg via INTRAVENOUS

## 2019-08-06 MED ORDER — SCOPOLAMINE 1 MG/3DAYS TD PT72
1.0000 | MEDICATED_PATCH | TRANSDERMAL | Status: DC
  Administered 2019-08-06: 1.5 mg via TRANSDERMAL
  Filled 2019-08-06: qty 1

## 2019-08-06 MED ORDER — ONDANSETRON HCL 4 MG/2ML IJ SOLN
INTRAMUSCULAR | Status: DC | PRN
  Administered 2019-08-06: 4 mg via INTRAVENOUS

## 2019-08-06 MED ORDER — PROMETHAZINE HCL 25 MG/ML IJ SOLN
6.2500 mg | INTRAMUSCULAR | Status: DC | PRN
  Filled 2019-08-06: qty 1

## 2019-08-06 MED ORDER — ACETAMINOPHEN 500 MG PO TABS
ORAL_TABLET | ORAL | Status: AC
  Filled 2019-08-06: qty 2

## 2019-08-06 MED ORDER — DEXAMETHASONE SODIUM PHOSPHATE 10 MG/ML IJ SOLN
INTRAMUSCULAR | Status: DC | PRN
  Administered 2019-08-06: 10 mg via INTRAVENOUS

## 2019-08-06 MED ORDER — LACTATED RINGERS IV SOLN
INTRAVENOUS | Status: DC
  Filled 2019-08-06: qty 1000

## 2019-08-06 MED ORDER — OXYCODONE HCL 5 MG PO TABS
5.0000 mg | ORAL_TABLET | Freq: Once | ORAL | Status: DC | PRN
  Filled 2019-08-06: qty 1

## 2019-08-06 MED ORDER — OXYCODONE-ACETAMINOPHEN 5-325 MG PO TABS: 1.0000 | ORAL_TABLET | Freq: Four times a day (QID) | ORAL | 0 refills | Status: AC | PRN

## 2019-08-06 MED ORDER — PROPOFOL 10 MG/ML IV BOLUS
INTRAVENOUS | Status: AC
  Filled 2019-08-06: qty 20

## 2019-08-06 MED ORDER — LIDOCAINE 2% (20 MG/ML) 5 ML SYRINGE
INTRAMUSCULAR | Status: AC
  Filled 2019-08-06: qty 5

## 2019-08-06 MED ORDER — ROCURONIUM BROMIDE 50 MG/5ML IV SOSY
PREFILLED_SYRINGE | INTRAVENOUS | Status: DC | PRN
  Administered 2019-08-06: 40 mg via INTRAVENOUS

## 2019-08-06 MED ORDER — FENTANYL CITRATE (PF) 100 MCG/2ML IJ SOLN
INTRAMUSCULAR | Status: DC | PRN
  Administered 2019-08-06 (×2): 50 ug via INTRAVENOUS

## 2019-08-06 MED ORDER — CEFAZOLIN SODIUM-DEXTROSE 2-4 GM/100ML-% IV SOLN
2.0000 g | INTRAVENOUS | Status: AC
  Administered 2019-08-06: 2 g via INTRAVENOUS
  Filled 2019-08-06: qty 100

## 2019-08-06 MED ORDER — CEFAZOLIN SODIUM-DEXTROSE 2-4 GM/100ML-% IV SOLN
INTRAVENOUS | Status: AC
  Filled 2019-08-06: qty 100

## 2019-08-06 MED ORDER — BUPIVACAINE HCL (PF) 0.25 % IJ SOLN
INTRAMUSCULAR | Status: DC | PRN
  Administered 2019-08-06: 15 mL

## 2019-08-06 MED ORDER — ONDANSETRON HCL 4 MG/2ML IJ SOLN
INTRAMUSCULAR | Status: AC
  Filled 2019-08-06: qty 2

## 2019-08-06 MED ORDER — PHENYLEPHRINE 40 MCG/ML (10ML) SYRINGE FOR IV PUSH (FOR BLOOD PRESSURE SUPPORT)
PREFILLED_SYRINGE | INTRAVENOUS | Status: AC
  Filled 2019-08-06: qty 10

## 2019-08-06 MED ORDER — FAMOTIDINE 20 MG PO TABS
ORAL_TABLET | ORAL | Status: AC
  Filled 2019-08-06: qty 1

## 2019-08-06 MED ORDER — FAMOTIDINE 20 MG PO TABS
20.0000 mg | ORAL_TABLET | Freq: Once | ORAL | Status: AC
  Administered 2019-08-06: 20 mg via ORAL
  Filled 2019-08-06: qty 1

## 2019-08-06 MED ORDER — PROPOFOL 10 MG/ML IV BOLUS
INTRAVENOUS | Status: DC | PRN
  Administered 2019-08-06: 50 mg via INTRAVENOUS
  Administered 2019-08-06: 150 mg via INTRAVENOUS

## 2019-08-06 MED ORDER — KETOROLAC TROMETHAMINE 30 MG/ML IJ SOLN
INTRAMUSCULAR | Status: AC
  Filled 2019-08-06: qty 1

## 2019-08-06 MED ORDER — KETOROLAC TROMETHAMINE 30 MG/ML IJ SOLN
INTRAMUSCULAR | Status: DC | PRN
  Administered 2019-08-06 (×2): 30 mg via INTRAVENOUS

## 2019-08-06 SURGICAL SUPPLY — 25 items
CATH ROBINSON RED A/P 16FR (CATHETERS) ×4 IMPLANT
DERMABOND ADVANCED (GAUZE/BANDAGES/DRESSINGS) ×2
DERMABOND ADVANCED .7 DNX12 (GAUZE/BANDAGES/DRESSINGS) ×2 IMPLANT
DRSG OPSITE POSTOP 3X4 (GAUZE/BANDAGES/DRESSINGS) IMPLANT
DURAPREP 26ML APPLICATOR (WOUND CARE) ×4 IMPLANT
GLOVE BIO SURGEON STRL SZ7 (GLOVE) ×4 IMPLANT
GLOVE BIOGEL PI IND STRL 7.0 (GLOVE) ×4 IMPLANT
GLOVE BIOGEL PI INDICATOR 7.0 (GLOVE) ×4
GOWN STRL REUS W/ TWL LRG LVL3 (GOWN DISPOSABLE) ×6 IMPLANT
GOWN STRL REUS W/TWL LRG LVL3 (GOWN DISPOSABLE) ×6
KIT TURNOVER KIT B (KITS) ×4 IMPLANT
LIGASURE VESSEL 5MM BLUNT TIP (ELECTROSURGICAL) ×4 IMPLANT
MANIPULATOR UTERINE 4.5 ZUMI (MISCELLANEOUS) ×4 IMPLANT
PACK LAPAROSCOPY BASIN (CUSTOM PROCEDURE TRAY) ×4 IMPLANT
PACK TRENDGUARD 450 HYBRID PRO (MISCELLANEOUS) ×2 IMPLANT
SCISSORS LAP 5X35 DISP (ENDOMECHANICALS) IMPLANT
SET IRRIG TUBING LAPAROSCOPIC (IRRIGATION / IRRIGATOR) IMPLANT
SET TUBE SMOKE EVAC HIGH FLOW (TUBING) ×4 IMPLANT
SUT VICRYL 0 UR6 27IN ABS (SUTURE) ×4 IMPLANT
SUT VICRYL 4-0 PS2 18IN ABS (SUTURE) ×4 IMPLANT
TOWEL OR 17X26 10 PK STRL BLUE (TOWEL DISPOSABLE) ×4 IMPLANT
TRENDGUARD 450 HYBRID PRO PACK (MISCELLANEOUS) ×4
TROCAR BALLN 12MMX100 BLUNT (TROCAR) ×4 IMPLANT
TROCAR XCEL NON-BLD 5MMX100MML (ENDOMECHANICALS) ×8 IMPLANT
WARMER LAPAROSCOPE (MISCELLANEOUS) ×4 IMPLANT

## 2019-08-06 NOTE — Discharge Instructions (Addendum)
Post Anesthesia Home Care Instructions  Activity: Get plenty of rest for the remainder of the day. A responsible adult should stay with you for 24 hours following the procedure.  For the next 24 hours, DO NOT: -Drive a car -Paediatric nurse -Drink alcoholic beverages -Take any medication unless instructed by your physician -Make any legal decisions or sign important papers.  Meals: Start with liquid foods such as gelatin or soup. Progress to regular foods as tolerated. Avoid greasy, spicy, heavy foods. If nausea and/or vomiting occur, drink only clear liquids until the nausea and/or vomiting subsides. Call your physician if vomiting continues.  Special Instructions/Symptoms: Your throat may feel dry or sore from the anesthesia or the breathing tube placed in your throat during surgery. If this causes discomfort, gargle with warm salt water. The discomfort should disappear within 24 hours.  If you had a scopolamine patch placed behind your ear for the management of post- operative nausea and/or vomiting:  1. The medication in the patch is effective for 72 hours, after which it should be removed.  Wrap patch in a tissue and discard in the trash. Wash hands thoroughly with soap and water. 2. You may remove the patch earlier than 72 hours if you experience unpleasant side effects which may include dry mouth, dizziness or visual disturbances. 3. Avoid touching the patch. Wash your hands with soap and water after contact with the patch.   Laparoscopy, Care After This sheet gives you information about how to care for yourself after your procedure. Your health care provider may also give you more specific instructions. If you have problems or questions, contact your health care provider. What can I expect after the procedure? After the procedure, it is common to have:  Mild discomfort in the abdomen.  Sore throat. Women who have laparoscopy with pelvic examination may have mild cramping and  fluid coming from the vagina for a few days after the procedure. Follow these instructions at home: Medicines  Take over-the-counter and prescription medicines only as told by your health care provider.  If you were prescribed an antibiotic medicine, take it as told by your health care provider. Do not stop taking the antibiotic even if you start to feel better. Driving  Do not drive for 24 hours if you were given a medicine to help you relax (sedative) during your procedure.  Do not drive or use heavy machinery while taking prescription pain medicine. Bathing  Do not take baths, swim, or use a hot tub until your health care provider approves. You may take showers. Incision care   Follow instructions from your health care provider about how to take care of your incisions. Make sure you: ? Wash your hands with soap and water before you change your bandage (dressing). If soap and water are not available, use hand sanitizer. ? Change your dressing as told by your health care provider. ? Leave stitches (sutures), skin glue, or adhesive strips in place. These skin closures may need to stay in place for 2 weeks or longer. If adhesive strip edges start to loosen and curl up, you may trim the loose edges. Do not remove adhesive strips completely unless your health care provider tells you to do that.  Check your incision areas every day for signs of infection. Check for: ? Redness, swelling, or pain. ? Fluid or blood. ? Warmth. ? Pus or a bad smell. Activity  Return to your normal activities as told by your health care provider. Ask your health  care provider what activities are safe for you.  Do not lift anything that is heavier than 10 lb (4.5 kg), or the limit that you are told, until your health care provider says that it is safe. General instructions  To prevent or treat constipation while you are taking prescription pain medicine, your health care provider may recommend that  you: ? Drink enough fluid to keep your urine pale yellow. ? Take over-the-counter or prescription medicines. ? Eat foods that are high in fiber, such as fresh fruits and vegetables, whole grains, and beans. ? Limit foods that are high in fat and processed sugars, such as fried and sweet foods.  Do not use any products that contain nicotine or tobacco, such as cigarettes and e-cigarettes. If you need help quitting, ask your health care provider.  Keep all follow-up visits as told by your health care provider. This is important. Contact a health care provider if:  You develop shoulder pain.  You feel lightheaded or faint.  You are unable to pass gas or have a bowel movement.  You feel nauseous or you vomit.  You develop a rash.  You have redness, swelling, or pain around any incision.  You have fluid or blood coming from any incision.  Any incision feels warm to the touch.  You have pus or a bad smell coming from any incision.  You have a fever or chills. Get help right away if:  You have severe pain.  You have vomiting that does not go away.  You have heavy bleeding from the vagina.  Any incision opens.  You have trouble breathing.  You have chest pain. Summary  After the procedure, it is common to have mild discomfort in the abdomen and a sore throat.  Check your incision areas every day for signs of infection.  Return to your normal activities as told by your health care provider. Ask your health care provider what activities are safe for you. This information is not intended to replace advice given to you by your health care provider. Make sure you discuss any questions you have with your health care provider. Document Revised: 06/17/2017 Document Reviewed: 12/29/2016 Elsevier Patient Education  2020 [employer]. Ectopic Pregnancy  An ectopic pregnancy happens when a fertilized egg grows outside the womb (uterus). The fertilized egg cannot stay alive outside  of the womb. This problem often happens in a fallopian tube. It is often caused by damage to the tube. If this problem is found early, you may be treated with medicine that stops the egg from growing. If your tube tears or bursts open (ruptures), you will bleed inside. Often, there is very bad pain in the lower belly. This is an emergency. You will need surgery. Get help right away. Follow these instructions at home: After being treated with medicine or surgery:  Rest and limit your activity for as long as told by your doctor.  Until your doctor says that it is safe: ? Do not lift anything that is heavier than 10 lb (4.5 kg) or the limit that your doctor tells you. ? Avoid exercise and any movement that takes a lot of effort.  To prevent problems when pooping (constipation): ? Eat a healthy diet. This includes:  Fruits.  Vegetables.  Whole grains. ? Drink 6-8 glasses of water a day. Contact a doctor if: Get help right away if:  You have sudden and very bad pain in your belly.  You have very bad pain in your  shoulders or neck.  You have pain that gets worse and is not helped by medicine.  You have: ? A fever or chills. ? Vaginal bleeding. ? Redness or swelling at the site of a surgical cut (incision).  You feel sick to your stomach (nauseous) or you throw up (vomit).  You feel dizzy or weak.  You feel light-headed or you pass out (faint). Summary  An ectopic pregnancy happens when a fertilized egg grows outside the womb (uterus).  If this problem is found early, you may be treated with medicine that stops the egg from growing.  If your tube tears or bursts open (ruptures), you will need surgery. This is an emergency. Get help right away. This information is not intended to replace advice given to you by your health care provider. Make sure you discuss any questions you have with your health care provider. Document Revised: 06/17/2017 Document Reviewed:  07/29/2016 Elsevier Patient Education  2020 [employer].

## 2019-08-06 NOTE — H&P (Signed)
Heather Mcclure is an 29 y.o. female G1. PCOS, spontaneous pregnancy. Right adnexal ectopic pregnancy diagnosed on 07/18/19, received 1st Methotrexate injection then for adnexal mass 0.7 mm, no IUP and abn rise in quant HCG. Since then she had normal drop of 20% HCG from D4 to D7 but D14 noted rise in quant again and after counseling and sono in MAU, we chose 2nd Methotrexate dose for quant in 1200 range with no IUP and adnexal mass 1.9 cm. Today she is 1 wk since 2nd Methotrexate, office quant failed to show 20% drop and pelvic sono noted 1.4 x 1.5 cm right adnexal mass now with well-defined gestational sac and yolk sac and tiny fetal pole. So this is failed methotrexate and she is advised surgery today and she agrees.   Denies pain/ heavy bleeding/ SOB/ CP/ dizziness Nl Paps. No breast complaints. Married, monogamous, no STIs   Patient's last menstrual period was 06/12/2019.    History reviewed. No pertinent past medical history.  History reviewed. No pertinent surgical history.  Family History  Problem Relation Age of Onset  . Heart disease Father     Social History:  reports that she has never smoked. She has never used smokeless tobacco. She reports that she does not drink alcohol or use drugs.  Allergies: No Known Allergies  No medications prior to admission.    Review of Systems neg   Last menstrual period 06/12/2019. Physical Exam LMP 06/12/2019  Physical exam:  A&O x 3, no acute distress. Pleasant HEENT neg, no thyromegaly Lungs CTA bilat CV RRR, S1S2 normal Abdo soft, non tender, non acute Extr no edema/ tenderness Pelvic deferred  Assessment/Plan: 28 yo G1 with right adnexal unruptured ectopic pregnancy, stable patient with failed Methotrexate x 2 doses.  Here for Laparoscopy, right salpingectomy. Risks/complications of surgery reviewed incl infection, bleeding, damage to internal organs including bladder, bowels, ureters, blood vessels, other risks from  anesthesia, VTE and delayed complications of any surgery, complications in future surgery reviewed. Also reviewed decreased fertility with unilateral salpingectomy and possible damaged left tube already and need for IVF in future.   Robley Fries 08/06/2019, 1:03 PM

## 2019-08-06 NOTE — Op Note (Addendum)
Preoperative diagnosis: Right adnexal ectopic pregnancy, failed Methotrexate treatment after 2 doses  Postoperative diagnosis: Same. Left tubal patency noted  Procedure: Laparoscopic right salpingectomy                     Chromopertubation  Surgeon: Shea Evans, MD Assistants: Carlean Jews, CNM  Anesthesia Gen. Endotracheal IV fluids LR  1000 cc LR EBL minimal  5 cc Urine clear  50 cc clear pre-op cath Complications none Disposition PACU and home Specimens Right fallopian tube with unruptured ectopic pregnancy  Procedure Patient is 29 yo G1, with known ectopic pregnancy since 07/18/19 with 2 doses of Methotrexate for medical management but failed and is here for surgery.  Options were reviewed, best option at this point is to proceed with laparoscopy and perform salpingectomy.  Risk and complications of surgery including infection, bleeding, damage to internal organs, other complications including pneumonia, VTE were reviewed. Informed written consent was obtained and patient was brought to the operating room with IV running. Timeout was carried out. 2 gm Ancef given.  She underwent general anesthesia without difficulty and was given dorsal lithotomy position. She was prepped and draped in standard fashion after bladder drained with red rubber catheter. Uterine manipulator Zumi placed. 12 mm incision made at the umbilicus's lower edge after injecting marcaine. Fascia grasped and incised. Peritoneum entered bluntly with finger dissection. Hassan canula with balloon placed, balloon inflated. Pneumoperitoneum created.  Normal uterus, right tube with ectopic pregnancy noted and left tube and both the ovaries were normal. Two 5 mm lower quadrant incisions made after marcaine injection, 5 mm trocar/cannulas placed under vision. There was not adhesions or endometriosis noted. No hemoperitoneum. Right fallopian tube with ectopic noted. Using Ligasure, right salpingectomy was performed and appeared  hemostatic. Specimen was removed intact via central 12 mm port. Hemostasis noted. Chromopertubation performed to assess left tube and was patent with free flow of dye.  All ports removed under vision and pneumperitoneum released. Central port fascia closed with 0-Vicryl and all skin incisions closed with 4-0 Vicryl. Dressing placed in central port and Dermabond on all incisions. Uterine manipulator Zumi removed.  All counts were correct x2. Patient was reversed from general anesthesia extubated and brought out to the recovery room in stable condition.  Plan is to discharge home from recovery room. Surgical findings were discussed with patient and her husband. I was scrubbed the entire time and performed the surgery.   V.Shevette Bess, MD

## 2019-08-06 NOTE — Anesthesia Procedure Notes (Signed)
Procedure Name: Intubation Date/Time: 08/06/2019 2:12 PM Performed by: Bonney Aid, CRNA Pre-anesthesia Checklist: Patient identified, Emergency Drugs available, Suction available and Patient being monitored Patient Re-evaluated:Patient Re-evaluated prior to induction Oxygen Delivery Method: Circle system utilized Preoxygenation: Pre-oxygenation with 100% oxygen Induction Type: IV induction Ventilation: Mask ventilation without difficulty Laryngoscope Size: Mac and 3 Grade View: Grade I Tube type: Oral Tube size: 7.0 mm Number of attempts: 1 Airway Equipment and Method: Stylet Placement Confirmation: ETT inserted through vocal cords under direct vision,  positive ETCO2 and breath sounds checked- equal and bilateral Secured at: 21 cm Tube secured with: Tape Dental Injury: Teeth and Oropharynx as per pre-operative assessment

## 2019-08-06 NOTE — Anesthesia Postprocedure Evaluation (Signed)
Anesthesia Post Note  Patient: Heather Mcclure  Procedure(s) Performed: LAPAROSCOPIC RIGHT SALPINGECTOMY w/ ECTOPIC PREGNANCY (Right ) CHROMOPERTUBATION     Patient location during evaluation: PACU Anesthesia Type: General Level of consciousness: awake and alert and oriented Pain management: pain level controlled Vital Signs Assessment: post-procedure vital signs reviewed and stable Respiratory status: spontaneous breathing, nonlabored ventilation and respiratory function stable Cardiovascular status: blood pressure returned to baseline and stable Postop Assessment: no apparent nausea or vomiting Anesthetic complications: no    Last Vitals:  Vitals:   08/06/19 1600 08/06/19 1603  BP: 100/73   Pulse: 88 97  Resp: 12 (!) 22  Temp:  36.8 C  SpO2: 100% 100%    Last Pain:  Vitals:   08/06/19 1603  TempSrc: Oral  PainSc:                  [name] A.

## 2019-08-06 NOTE — Anesthesia Preprocedure Evaluation (Addendum)
Anesthesia Evaluation  Patient identified by MRN, date of birth, ID band Patient awake    Reviewed: Allergy & Precautions, NPO status , Patient's Chart, lab work & pertinent test results  Airway Mallampati: II  TM Distance: >3 FB Neck ROM: Full    Dental no notable dental hx.    Pulmonary neg pulmonary ROS,    Pulmonary exam normal breath sounds clear to auscultation       Cardiovascular negative cardio ROS Normal cardiovascular exam Rhythm:Regular Rate:Normal     Neuro/Psych negative neurological ROS  negative psych ROS   GI/Hepatic negative GI ROS, Neg liver ROS,   Endo/Other  PCOS  Renal/GU negative Renal ROS     Musculoskeletal negative musculoskeletal ROS (+)   Abdominal   Peds  Hematology negative hematology ROS (+)   Anesthesia Other Findings ectopic pregnancy  Reproductive/Obstetrics                            Anesthesia Physical Anesthesia Plan  ASA: II  Anesthesia Plan: General   Post-op Pain Management:    Induction: Intravenous  PONV Risk Score and Plan: 4 or greater and Scopolamine patch - Pre-op, Midazolam, Dexamethasone, Ondansetron and Treatment may vary due to age or medical condition  Airway Management Planned: Oral ETT  Additional Equipment:   Intra-op Plan:   Post-operative Plan: Extubation in OR  Informed Consent: I have reviewed the patients History and Physical, chart, labs and discussed the procedure including the risks, benefits and alternatives for the proposed anesthesia with the patient or authorized representative who has indicated his/her understanding and acceptance.     Dental advisory given  Plan Discussed with: CRNA  Anesthesia Plan Comments:        Anesthesia Quick Evaluation

## 2019-08-06 NOTE — Transfer of Care (Signed)
Immediate Anesthesia Transfer of Care Note  Patient: Heather Mcclure  Procedure(s) Performed: LAPAROSCOPIC RIGHT SALPINGECTOMY w/ ECTOPIC PREGNANCY (Right ) CHROMOPERTUBATION  Patient Location: PACU  Anesthesia Type:General  Level of Consciousness: drowsy  Airway & Oxygen Therapy: Patient Spontanous Breathing and Patient connected to nasal cannula oxygen  Post-op Assessment: Report given to RN  Post vital signs: Reviewed and stable  Last Vitals: 103/53 Vitals Value Taken Time  BP    Temp    Pulse 106 08/06/19 1508  Resp 20 08/06/19 1508  SpO2 100 % 08/06/19 1508  Vitals shown include unvalidated device data.  Last Pain:  Vitals:   08/06/19 1346  TempSrc: Oral  PainSc: 0-No pain      Patients Stated Pain Goal: 2 (08/06/19 1346)  Complications: No apparent anesthesia complications

## 2019-08-07 LAB — SURGICAL PATHOLOGY

## 2019-11-03 ENCOUNTER — Ambulatory Visit: Payer: 59 | Attending: Internal Medicine

## 2019-11-03 DIAGNOSIS — Z23 Encounter for immunization: Secondary | ICD-10-CM

## 2019-11-03 NOTE — Progress Notes (Signed)
   Covid-19 Vaccination Clinic  Name:  Heather Mcclure    MRN: [id number] DOB: 02/24/1991  11/03/2019  Heather Mcclure was observed post Covid-19 immunization for 15 minutes without incident. She was provided with Vaccine Information Sheet and instruction to access the V-Safe system.   Heather Mcclure was instructed to call 911 with any severe reactions post vaccine: Marland Kitchen Difficulty breathing  . Swelling of face and throat  . A fast heartbeat  . A bad rash all over body  . Dizziness and weakness   Immunizations Administered    Name Date Dose VIS Date Route   Pfizer COVID-19 Vaccine 11/03/2019 10:02 AM 0.3 mL 06/29/2019 Intramuscular   Manufacturer: [employer], [employer]   Lot: [id number]   NDC: [address]-9

## 2019-11-26 ENCOUNTER — Ambulatory Visit: Payer: Self-pay | Attending: Internal Medicine

## 2019-11-26 DIAGNOSIS — Z23 Encounter for immunization: Secondary | ICD-10-CM

## 2019-11-26 NOTE — Progress Notes (Signed)
   Covid-19 Vaccination Clinic  Name:  Heather Mcclure    MRN: [id number] DOB: 1991-07-13  11/26/2019  [name] was observed post Covid-19 immunization for 15 minutes without incident. She was provided with Vaccine Information Sheet and instruction to access the V-Safe system.   [name] was instructed to call 911 with any severe reactions post vaccine: Marland Kitchen Difficulty breathing  . Swelling of face and throat  . A fast heartbeat  . A bad rash all over body  . Dizziness and weakness   Immunizations Administered    Name Date Dose VIS Date Route   Pfizer COVID-19 Vaccine 11/26/2019  8:19 AM 0.3 mL 09/12/2018 Intramuscular   Manufacturer: [employer], [employer]   Lot: [id number]   NDC: [address]-1

## 2020-06-27 ENCOUNTER — Emergency Department (HOSPITAL_COMMUNITY)
Admission: EM | Admit: 2020-06-27 | Discharge: 2020-06-28 | Disposition: A | Discharge status: Home / Self Care | Payer: 59 | Attending: Emergency Medicine | Admitting: Emergency Medicine

## 2020-06-27 ENCOUNTER — Encounter (HOSPITAL_COMMUNITY): Payer: Self-pay

## 2020-06-27 ENCOUNTER — Emergency Department (HOSPITAL_COMMUNITY): Payer: 59

## 2020-06-27 ENCOUNTER — Other Ambulatory Visit: Payer: Self-pay

## 2020-06-27 DIAGNOSIS — Z5321 Procedure and treatment not carried out due to patient leaving prior to being seen by health care provider: Secondary | ICD-10-CM | POA: Insufficient documentation

## 2020-06-27 DIAGNOSIS — N939 Abnormal uterine and vaginal bleeding, unspecified: Secondary | ICD-10-CM | POA: Diagnosis present

## 2020-06-27 LAB — COMPREHENSIVE METABOLIC PANEL
ALT: 7 U/L (ref 0–44)
AST: 17 U/L (ref 15–41)
Albumin: 4.1 g/dL (ref 3.5–5.0)
Alkaline Phosphatase: 60 U/L (ref 38–126)
Anion gap: 12 (ref 5–15)
BUN: 5 mg/dL — ABNORMAL LOW (ref 6–20)
CO2: 22 mmol/L (ref 22–32)
Calcium: 9.4 mg/dL (ref 8.9–10.3)
Chloride: 104 mmol/L (ref 98–111)
Creatinine, Ser: 0.55 mg/dL (ref 0.44–1.00)
GFR, Estimated: >60 mL/min (ref >60)
Glucose, Bld: 113 mg/dL — ABNORMAL HIGH (ref 70–99)
Potassium: 3.4 mmol/L — ABNORMAL LOW (ref 3.5–5.1)
Sodium: 138 mmol/L (ref 135–145)
Total Bilirubin: 0.6 mg/dL (ref 0.3–1.2)
Total Protein: 7.3 g/dL (ref 6.5–8.1)

## 2020-06-27 LAB — ABO/RH: ABO/RH(D): A POS

## 2020-06-27 LAB — CBC WITH DIFFERENTIAL/PLATELET
Abs Immature Granulocytes: 0.02 10*3/uL (ref 0.00–0.07)
Basophils Absolute: 0.1 10*3/uL (ref 0.0–0.1)
Basophils Relative: 1 %
Eosinophils Absolute: 0.1 10*3/uL (ref 0.0–0.5)
Eosinophils Relative: 1 %
HCT: 37.3 % (ref 36.0–46.0)
Hemoglobin: 12.7 g/dL (ref 12.0–15.0)
Immature Granulocytes: 0 %
Lymphocytes Relative: 29 %
Lymphs Abs: 2.2 10*3/uL (ref 0.7–4.0)
MCH: 29.4 pg (ref 26.0–34.0)
MCHC: 34 g/dL (ref 30.0–36.0)
MCV: 86.3 fL (ref 80.0–100.0)
Monocytes Absolute: 0.6 10*3/uL (ref 0.1–1.0)
Monocytes Relative: 8 %
Neutro Abs: 4.7 10*3/uL (ref 1.7–7.7)
Neutrophils Relative %: 61 %
Platelets: 421 10*3/uL — ABNORMAL HIGH (ref 150–400)
RBC: 4.32 MIL/uL (ref 3.87–5.11)
RDW: 11.9 % (ref 11.5–15.5)
WBC: 7.7 10*3/uL (ref 4.0–10.5)
nRBC: 0 % (ref 0.0–0.2)

## 2020-06-27 LAB — URINALYSIS, ROUTINE W REFLEX MICROSCOPIC
Bilirubin Urine: NEGATIVE
Glucose, UA: NEGATIVE mg/dL
Ketones, ur: 5 mg/dL — AB
Nitrite: NEGATIVE
Protein, ur: 30 mg/dL — AB
RBC / HPF: >50 RBC/hpf — ABNORMAL HIGH (ref 0–5)
Specific Gravity, Urine: 1.015 (ref 1.005–1.030)
pH: 6 (ref 5.0–8.0)

## 2020-06-27 LAB — I-STAT BETA HCG BLOOD, ED (MC, WL, AP ONLY): I-stat hCG, quantitative: 15.5 m[IU]/mL — ABNORMAL HIGH (ref <5)

## 2020-06-27 NOTE — ED Triage Notes (Signed)
Pt came in POV with c/o of vaginal bleeding that started around 1715 this evening. Pt states her flow is heavy but no abd pain or cramps. Pt has a hx of ectopic pregnancy last December, and the flow then was not as bad then as it is now. Pt states her HCG level was 15 per yesterdays results.

## 2020-06-28 NOTE — ED Notes (Signed)
LWBS 

## 2020-09-10 LAB — OB RESULTS CONSOLE HIV ANTIBODY (ROUTINE TESTING): HIV: NONREACTIVE

## 2020-09-10 LAB — OB RESULTS CONSOLE GC/CHLAMYDIA
Chlamydia: NEGATIVE
Gonorrhea: NEGATIVE

## 2020-09-10 LAB — OB RESULTS CONSOLE HEPATITIS B SURFACE ANTIGEN: Hepatitis B Surface Ag: NEGATIVE

## 2020-09-10 LAB — OB RESULTS CONSOLE RPR: RPR: NONREACTIVE

## 2020-09-10 LAB — OB RESULTS CONSOLE RUBELLA ANTIBODY, IGM: Rubella: IMMUNE

## 2021-01-26 ENCOUNTER — Inpatient Hospital Stay (HOSPITAL_COMMUNITY)
Admission: AD | Admit: 2021-01-26 | Discharge: 2021-01-27 | Disposition: A | Discharge status: Home / Self Care | Payer: 59 | Attending: Obstetrics and Gynecology | Admitting: Obstetrics and Gynecology

## 2021-01-26 ENCOUNTER — Encounter (HOSPITAL_COMMUNITY): Payer: Self-pay | Admitting: Obstetrics and Gynecology

## 2021-01-26 ENCOUNTER — Other Ambulatory Visit: Payer: Self-pay

## 2021-01-26 DIAGNOSIS — O26899 Other specified pregnancy related conditions, unspecified trimester: Secondary | ICD-10-CM

## 2021-01-26 DIAGNOSIS — Z3A29 29 weeks gestation of pregnancy: Secondary | ICD-10-CM

## 2021-01-26 DIAGNOSIS — R1030 Lower abdominal pain, unspecified: Secondary | ICD-10-CM

## 2021-01-26 DIAGNOSIS — M791 Myalgia, unspecified site: Secondary | ICD-10-CM

## 2021-01-26 DIAGNOSIS — O26893 Other specified pregnancy related conditions, third trimester: Secondary | ICD-10-CM | POA: Diagnosis not present

## 2021-01-26 DIAGNOSIS — R102 Pelvic and perineal pain: Secondary | ICD-10-CM | POA: Diagnosis not present

## 2021-01-26 LAB — URINALYSIS, ROUTINE W REFLEX MICROSCOPIC
Bilirubin Urine: NEGATIVE
Glucose, UA: NEGATIVE mg/dL
Hgb urine dipstick: NEGATIVE
Ketones, ur: NEGATIVE mg/dL
Leukocytes,Ua: NEGATIVE
Nitrite: NEGATIVE
Protein, ur: NEGATIVE mg/dL
Specific Gravity, Urine: 1.002 — ABNORMAL LOW (ref 1.005–1.030)
pH: 8 (ref 5.0–8.0)

## 2021-01-26 MED ORDER — PANTOPRAZOLE SODIUM 40 MG PO TBEC
40.0000 mg | DELAYED_RELEASE_TABLET | Freq: Once | ORAL | Status: AC
  Administered 2021-01-26: 40 mg via ORAL
  Filled 2021-01-26: qty 1

## 2021-01-26 MED ORDER — ACETAMINOPHEN 500 MG PO TABS
1000.0000 mg | ORAL_TABLET | Freq: Once | ORAL | Status: AC
  Administered 2021-01-26: 1000 mg via ORAL
  Filled 2021-01-26: qty 2

## 2021-01-26 NOTE — MAU Provider Note (Signed)
Chief Complaint:  Abdominal Pain   None    HPI: Heather Mcclure is a 30 y.o. G3P0020 at [redacted]w[redacted]d who presents to maternity admissions reporting concern of bilateral lower abdominal pain. Pt reports that her pain initially felt like heartburn given it was worse with laying flat and worse after eating. She states that she called EMS and then took gasX with some improvement. She then went for a walk and now endorses constant, cramping discomfort in her lower abdomen bilaterally. No improvement with pepcid and tums taken at 2000 prior to arrival. Pt also reports loose stools but no nausea and vomiting. No known sick contacts. Pt does report regular exercise with walking, yoga and stretching. No history of kidney stones.  She reports good fetal movement, denies LOF, vaginal bleeding, vaginal itching/burning, urinary symptoms, h/a, dizziness, n/v, or fever/chills.  Past Medical History: History reviewed. No pertinent past medical history.  Past obstetric history: OB History  Gravida Para Term Preterm AB Living  3       2 0  SAB IAB Ectopic Multiple Live Births  1   1   0    # Outcome Date GA Lbr Len/2nd Weight Sex Delivery Anes PTL Lv  3 Current           2 Ectopic           1 SAB             Past Surgical History: Past Surgical History:  Procedure Laterality Date   CHROMOPERTUBATION  08/06/2019   Procedure: CHROMOPERTUBATION;  Surgeon: Shea Evans, MD;  Location: [facility];  Service: Gynecology;;   LAPAROSCOPIC UNILATERAL SALPINGECTOMY Right 08/06/2019   Procedure: LAPAROSCOPIC RIGHT SALPINGECTOMY w/ ECTOPIC PREGNANCY;  Surgeon: Shea Evans, MD;  Location: [facility];  Service: Gynecology;  Laterality: Right;    Family History: Family History  Problem Relation Age of Onset   Heart disease Father     Social History: Social History   Tobacco Use   Smoking status: Never   Smokeless tobacco: Never  Vaping Use   Vaping Use: Never used   Substance Use Topics   Alcohol use: Never   Drug use: Never    Allergies: No Known Allergies  Meds:  No medications prior to admission.    ROS:  Review of Systems  Constitutional:  Negative for chills, fatigue and fever.  HENT:  Negative for congestion and sore throat.   Eyes:  Negative for photophobia and visual disturbance.  Respiratory:  Negative for cough, chest tightness and shortness of breath.   Cardiovascular:  Negative for chest pain and leg swelling.  Gastrointestinal:  Positive for abdominal pain and diarrhea. Negative for constipation, nausea and vomiting.  Genitourinary:  Negative for dysuria, vaginal bleeding, vaginal discharge and vaginal pain.  Musculoskeletal:  Negative for back pain and myalgias.  Neurological:  Negative for dizziness, seizures, light-headedness and headaches.  Psychiatric/Behavioral:  The patient is nervous/anxious.    I have reviewed patient's Past Medical Hx, Surgical Hx, Family Hx, Social Hx, medications and allergies.   Physical Exam  Patient Vitals for the past 24 hrs:  BP Temp Temp src Pulse Resp Height Weight  01/27/21 0235 -- -- -- -- 20 -- --  01/26/21 2233 118/78 -- -- 89 -- -- --  01/26/21 2214 119/86 98.3 F (36.8 C) Oral (!) 101 20 4\' 11"  (1.499 m) 64.2 kg   Constitutional: Well-developed, well-nourished female in no acute distress. Anxious-appearing. Cardiovascular: normal rate Respiratory: normal effort  GI: Abd soft, mild diffuse tenderness to palpation, gravid appropriate for gestational age.  MS: Extremities nontender, no edema, normal ROM Neurologic: Alert and oriented x 4.  GU: Bimanual exam: Cervix 0/long/high, vaginal walls and external genitalia normal Dilation: Closed Effacement (%): Thick Station:  (HIGH) Exam by:: DR Barb Merino  FHT:  Baseline 140, moderate variability, accelerations present, no decelerations Contractions: none   Labs: Results for orders placed or performed during the hospital encounter of  01/26/21 (from the past 24 hour(s))  Urinalysis, Routine w reflex microscopic Urine, Clean Catch     Status: Abnormal   Collection Time: 01/26/21  9:57 PM  Result Value Ref Range   Color, Urine STRAW (A) YELLOW   APPearance CLEAR CLEAR   Specific Gravity, Urine 1.002 (L) 1.005 - 1.030   pH 8.0 5.0 - 8.0   Glucose, UA NEGATIVE NEGATIVE mg/dL   Hgb urine dipstick NEGATIVE NEGATIVE   Bilirubin Urine NEGATIVE NEGATIVE   Ketones, ur NEGATIVE NEGATIVE mg/dL   Protein, ur NEGATIVE NEGATIVE mg/dL   Nitrite NEGATIVE NEGATIVE   Leukocytes,Ua NEGATIVE NEGATIVE  Comprehensive metabolic panel     Status: Abnormal   Collection Time: 01/27/21 12:01 AM  Result Value Ref Range   Sodium 136 135 - 145 mmol/L   Potassium 3.8 3.5 - 5.1 mmol/L   Chloride 108 98 - 111 mmol/L   CO2 21 (L) 22 - 32 mmol/L   Glucose, Bld 89 70 - 99 mg/dL   BUN <5 (L) 6 - 20 mg/dL   Creatinine, Ser 1.60 0.44 - 1.00 mg/dL   Calcium 9.6 8.9 - 10.9 mg/dL   Total Protein 6.6 6.5 - 8.1 g/dL   Albumin 2.9 (L) 3.5 - 5.0 g/dL   AST 20 15 - 41 U/L   ALT 9 0 - 44 U/L   Alkaline Phosphatase 101 38 - 126 U/L   Total Bilirubin 0.4 0.3 - 1.2 mg/dL   GFR, Estimated >32 >35 mL/min   Anion gap 7 5 - 15  CBC     Status: Abnormal   Collection Time: 01/27/21 12:01 AM  Result Value Ref Range   WBC 11.3 (H) 4.0 - 10.5 K/uL   RBC 3.55 (L) 3.87 - 5.11 MIL/uL   Hemoglobin 10.6 (L) 12.0 - 15.0 g/dL   HCT 57.3 (L) 22.0 - 25.4 %   MCV 89.0 80.0 - 100.0 fL   MCH 29.9 26.0 - 34.0 pg   MCHC 33.5 30.0 - 36.0 g/dL   RDW 27.0 62.3 - 76.2 %   Platelets 321 150 - 400 K/uL   nRBC 0.0 0.0 - 0.2 %  Wet prep, genital     Status: Abnormal   Collection Time: 01/27/21  1:08 AM   Specimen: PATH Cytology Cervicovaginal Ancillary Only  Result Value Ref Range   Yeast Wet Prep HPF POC NONE SEEN NONE SEEN   Trich, Wet Prep NONE SEEN NONE SEEN   Clue Cells Wet Prep HPF POC NONE SEEN NONE SEEN   WBC, Wet Prep HPF POC MANY (A) NONE SEEN   Sperm NONE SEEN     --/--/A POS Performed at [facility], [address] [place], [place] [address]  [phone]  Imaging:  No results found.  MAU Course/MDM: Orders Placed This Encounter  Procedures   Wet prep, genital   Urinalysis, Routine w reflex microscopic Urine, Clean Catch   Comprehensive metabolic panel   CBC   Discharge patient Discharge disposition: 01-Home or Self Care; Discharge patient date: 01/27/2021  Meds ordered this encounter  Medications   pantoprazole (PROTONIX) EC tablet 40 mg   acetaminophen (TYLENOL) tablet 1,000 mg   Prenatal Vit-Fe Fumarate-FA (PRENATAL VITAMIN) 27-0.8 MG TABS    Sig: Take 1 tablet by mouth daily.    Dispense:  30 tablet    NST reviewed and reactive. Pt reports no improvement in symptoms with tylenol and protonix in MAU. Continues to report sharp bilateral pain in lower quadrants bilaterally. CBC and CMP unremarkable except for mild anemia (Hgb 10.6) and mild leukocytosis of 11.3. UA and wet prep unremarkable. Pt able to tolerate po intake without recurrent symptoms.  Treatments in MAU included tylenol, protonix. Pt declined flexeril.  Pt discharge with strict return precautions for preterm contractions, vaginal bleeding, leakage of fluid, decreased fetal movement, worsening/recurrent abdominal pain or other concerns.  Assessment & Plan: Sueann Brownley is a 30 y.o. G3P0020 at [redacted]w[redacted]d who presents to maternity admissions reporting concern of bilateral lower abdominal pain. Differential diagnosis includes mild GI illness or gas pain given report of diarrhea but most likely round ligament pain and muscle soreness given diffuse tenderness to palpation with report of daily yoga and stretching. No signs/symptoms of preeclampsia. Unlikely kidney stone given bilateral discomfort and negative UA. No signs of UTI on UA. Reassuringly, pt reported near resolution of symptoms prior to discharge home. 1. Pain of round ligament affecting pregnancy, antepartum    2. Muscle soreness   -provided reassurance and discussed results as noted above -Recommended use of belly band for back support and tylenol as needed for abdominal discomfort -Recommended continued use of pepcid and TUMS prn in addition to changes in diet given concern of possible gas pain and GERD -Discharge home with close clinic f/u on 7/13 as previously scheduled. -Labor precautions and fetal kick counts  Allergies as of 01/27/2021   No Known Allergies      Medication List     STOP taking these medications    ibuprofen 200 MG tablet Commonly known as: Advil       TAKE these medications    acetaminophen 500 MG tablet Commonly known as: TYLENOL Take 1 tablet (500 mg total) by mouth every 6 (six) hours as needed.   calcium carbonate 500 MG chewable tablet Commonly known as: TUMS - dosed in mg elemental calcium Chew 1 tablet by mouth daily.   famotidine 20 MG tablet Commonly known as: PEPCID Take 20 mg by mouth 2 (two) times daily.   Prenatal Vitamin 27-0.8 MG Tabs Take 1 tablet by mouth daily.   promethazine 12.5 MG tablet Commonly known as: PHENERGAN Take 1 tablet (12.5 mg total) by mouth every 6 (six) hours as needed for nausea or vomiting.      Sheila Oats, MD OB Fellow, Faculty [facility] 01/27/2021 9:17 AM

## 2021-01-26 NOTE — MAU Note (Signed)
PT SAYS Sunday AM- HAD UPPER ABD PAIN- SHE DID HOME REMEDIES - HELPED.  SHE LAYED DOWN- UPPER BAD RETURNED WITH HEARTBURN  - THEN PAIN WENT TO ALL ABD  SHE CALLED EMS- CHECKED BP, BS, O2- TOLD HER IT  WAS GAS- SO SHE TOOK GASX-  SHE WENT FOR A WALK. THEN TODAY - AT 7PM- PAIN ON RIGHT SIDE  WORSE WITH EATING  NOW 2-3. - AT 8PM- TOOK PEPCID AND TUMS

## 2021-01-27 LAB — COMPREHENSIVE METABOLIC PANEL
ALT: 9 U/L (ref 0–44)
AST: 20 U/L (ref 15–41)
Albumin: 2.9 g/dL — ABNORMAL LOW (ref 3.5–5.0)
Alkaline Phosphatase: 101 U/L (ref 38–126)
Anion gap: 7 (ref 5–15)
BUN: <5 mg/dL — ABNORMAL LOW (ref 6–20)
CO2: 21 mmol/L — ABNORMAL LOW (ref 22–32)
Calcium: 9.6 mg/dL (ref 8.9–10.3)
Chloride: 108 mmol/L (ref 98–111)
Creatinine, Ser: 0.45 mg/dL (ref 0.44–1.00)
GFR, Estimated: >60 mL/min (ref >60)
Glucose, Bld: 89 mg/dL (ref 70–99)
Potassium: 3.8 mmol/L (ref 3.5–5.1)
Sodium: 136 mmol/L (ref 135–145)
Total Bilirubin: 0.4 mg/dL (ref 0.3–1.2)
Total Protein: 6.6 g/dL (ref 6.5–8.1)

## 2021-01-27 LAB — GC/CHLAMYDIA PROBE AMP (~~LOC~~) NOT AT ARMC
Chlamydia: NEGATIVE
Comment: NEGATIVE
Comment: NORMAL
Neisseria Gonorrhea: NEGATIVE

## 2021-01-27 LAB — CBC
HCT: 31.6 % — ABNORMAL LOW (ref 36.0–46.0)
Hemoglobin: 10.6 g/dL — ABNORMAL LOW (ref 12.0–15.0)
MCH: 29.9 pg (ref 26.0–34.0)
MCHC: 33.5 g/dL (ref 30.0–36.0)
MCV: 89 fL (ref 80.0–100.0)
Platelets: 321 10*3/uL (ref 150–400)
RBC: 3.55 MIL/uL — ABNORMAL LOW (ref 3.87–5.11)
RDW: 14.2 % (ref 11.5–15.5)
WBC: 11.3 10*3/uL — ABNORMAL HIGH (ref 4.0–10.5)
nRBC: 0 % (ref 0.0–0.2)

## 2021-01-27 LAB — WET PREP, GENITAL
Clue Cells Wet Prep HPF POC: NONE SEEN
Sperm: NONE SEEN
Trich, Wet Prep: NONE SEEN
Yeast Wet Prep HPF POC: NONE SEEN

## 2021-01-27 MED ORDER — PRENATAL VITAMIN 27-0.8 MG PO TABS: 1.0000 | ORAL_TABLET | Freq: Every day | ORAL | Status: AC

## 2021-03-06 ENCOUNTER — Encounter (HOSPITAL_COMMUNITY): Payer: Self-pay | Admitting: Obstetrics

## 2021-03-06 ENCOUNTER — Observation Stay (HOSPITAL_COMMUNITY)
Admission: AD | Admit: 2021-03-06 | Discharge: 2021-03-08 | Disposition: A | Discharge status: Home / Self Care | Payer: 59 | Attending: Obstetrics | Admitting: Obstetrics

## 2021-03-06 ENCOUNTER — Other Ambulatory Visit: Payer: Self-pay

## 2021-03-06 DIAGNOSIS — O1493 Unspecified pre-eclampsia, third trimester: Principal | ICD-10-CM | POA: Insufficient documentation

## 2021-03-06 DIAGNOSIS — Z20822 Contact with and (suspected) exposure to covid-19: Secondary | ICD-10-CM | POA: Insufficient documentation

## 2021-03-06 DIAGNOSIS — O1490 Unspecified pre-eclampsia, unspecified trimester: Secondary | ICD-10-CM | POA: Diagnosis present

## 2021-03-06 DIAGNOSIS — Z3A35 35 weeks gestation of pregnancy: Secondary | ICD-10-CM | POA: Insufficient documentation

## 2021-03-06 LAB — COMPREHENSIVE METABOLIC PANEL
ALT: 9 U/L (ref 0–44)
AST: 23 U/L (ref 15–41)
Albumin: 2.7 g/dL — ABNORMAL LOW (ref 3.5–5.0)
Alkaline Phosphatase: 135 U/L — ABNORMAL HIGH (ref 38–126)
Anion gap: 11 (ref 5–15)
BUN: 6 mg/dL (ref 6–20)
CO2: 18 mmol/L — ABNORMAL LOW (ref 22–32)
Calcium: 9.3 mg/dL (ref 8.9–10.3)
Chloride: 106 mmol/L (ref 98–111)
Creatinine, Ser: 0.48 mg/dL (ref 0.44–1.00)
GFR, Estimated: >60 mL/min (ref >60)
Glucose, Bld: 74 mg/dL (ref 70–99)
Potassium: 3.8 mmol/L (ref 3.5–5.1)
Sodium: 135 mmol/L (ref 135–145)
Total Bilirubin: 0.3 mg/dL (ref 0.3–1.2)
Total Protein: 6.3 g/dL — ABNORMAL LOW (ref 6.5–8.1)

## 2021-03-06 LAB — RESP PANEL BY RT-PCR (FLU A&B, COVID) ARPGX2
Influenza A by PCR: NEGATIVE
Influenza B by PCR: NEGATIVE
SARS Coronavirus 2 by RT PCR: NEGATIVE

## 2021-03-06 LAB — CBC
HCT: 32 % — ABNORMAL LOW (ref 36.0–46.0)
Hemoglobin: 10.8 g/dL — ABNORMAL LOW (ref 12.0–15.0)
MCH: 30.4 pg (ref 26.0–34.0)
MCHC: 33.8 g/dL (ref 30.0–36.0)
MCV: 90.1 fL (ref 80.0–100.0)
Platelets: 289 10*3/uL (ref 150–400)
RBC: 3.55 MIL/uL — ABNORMAL LOW (ref 3.87–5.11)
RDW: 13.6 % (ref 11.5–15.5)
WBC: 9 10*3/uL (ref 4.0–10.5)
nRBC: 0 % (ref 0.0–0.2)

## 2021-03-06 LAB — TYPE AND SCREEN
ABO/RH(D): A POS
Antibody Screen: NEGATIVE

## 2021-03-06 LAB — PROTEIN / CREATININE RATIO, URINE
Creatinine, Urine: 56.93 mg/dL
Protein Creatinine Ratio: 3.86 mg/mg{Cre} — ABNORMAL HIGH (ref 0.00–0.15)
Total Protein, Urine: 220 mg/dL

## 2021-03-06 MED ORDER — ACETAMINOPHEN 325 MG PO TABS
650.0000 mg | ORAL_TABLET | ORAL | Status: DC | PRN
  Administered 2021-03-07: 650 mg via ORAL
  Filled 2021-03-06: qty 2

## 2021-03-06 MED ORDER — FAMOTIDINE 20 MG PO TABS
20.0000 mg | ORAL_TABLET | Freq: Two times a day (BID) | ORAL | Status: DC
  Administered 2021-03-06 – 2021-03-08 (×4): 20 mg via ORAL
  Filled 2021-03-06 (×4): qty 1

## 2021-03-06 MED ORDER — BETAMETHASONE SOD PHOS & ACET 6 (3-3) MG/ML IJ SUSP
12.0000 mg | INTRAMUSCULAR | Status: AC
Start: 2021-03-06 — End: 2021-03-07
  Administered 2021-03-06 – 2021-03-07 (×2): 12 mg via INTRAMUSCULAR
  Filled 2021-03-06 (×2): qty 5

## 2021-03-06 MED ORDER — SODIUM CHLORIDE 0.9% FLUSH: 3.0000 mL | INTRAVENOUS | Status: DC | PRN

## 2021-03-06 MED ORDER — ZOLPIDEM TARTRATE 5 MG PO TABS
5.0000 mg | ORAL_TABLET | Freq: Every evening | ORAL | Status: DC | PRN
  Administered 2021-03-07 (×2): 5 mg via ORAL
  Filled 2021-03-06 (×2): qty 1

## 2021-03-06 MED ORDER — DOCUSATE SODIUM 100 MG PO CAPS: 100.0000 mg | ORAL_CAPSULE | Freq: Every day | ORAL | Status: DC

## 2021-03-06 MED ORDER — SODIUM CHLORIDE 0.9 % IV SOLN: 250.0000 mL | INTRAVENOUS | Status: DC | PRN

## 2021-03-06 MED ORDER — PRENATAL MULTIVITAMIN CH
1.0000 | ORAL_TABLET | Freq: Every day | ORAL | Status: DC
  Administered 2021-03-07: 1 via ORAL
  Filled 2021-03-06: qty 1

## 2021-03-06 MED ORDER — TERBUTALINE SULFATE 1 MG/ML IJ SOLN
0.2500 mg | Freq: Once | INTRAMUSCULAR | Status: AC
  Administered 2021-03-06: 0.25 mg via SUBCUTANEOUS
  Filled 2021-03-06: qty 1

## 2021-03-06 MED ORDER — DOCUSATE SODIUM 100 MG PO CAPS
100.0000 mg | ORAL_CAPSULE | Freq: Two times a day (BID) | ORAL | Status: DC
  Administered 2021-03-06 – 2021-03-08 (×4): 100 mg via ORAL
  Filled 2021-03-06 (×4): qty 1

## 2021-03-06 MED ORDER — CALCIUM CARBONATE ANTACID 500 MG PO CHEW: 2.0000 | CHEWABLE_TABLET | ORAL | Status: DC | PRN

## 2021-03-06 MED ORDER — SODIUM CHLORIDE 0.9% FLUSH
3.0000 mL | Freq: Two times a day (BID) | INTRAVENOUS | Status: DC
  Administered 2021-03-06 – 2021-03-08 (×3): 3 mL via INTRAVENOUS

## 2021-03-06 MED ORDER — LACTATED RINGERS IV BOLUS
500.0000 mL | Freq: Once | INTRAVENOUS | Status: AC
  Administered 2021-03-06: 500 mL via INTRAVENOUS

## 2021-03-06 MED ORDER — POLYSACCHARIDE IRON COMPLEX 150 MG PO CAPS
150.0000 mg | ORAL_CAPSULE | Freq: Every day | ORAL | Status: DC
  Administered 2021-03-07 – 2021-03-08 (×2): 150 mg via ORAL
  Filled 2021-03-06 (×3): qty 1

## 2021-03-06 MED ORDER — LABETALOL HCL 100 MG PO TABS
100.0000 mg | ORAL_TABLET | Freq: Three times a day (TID) | ORAL | Status: DC
  Administered 2021-03-06 – 2021-03-08 (×4): 100 mg via ORAL
  Filled 2021-03-06 (×4): qty 1

## 2021-03-06 NOTE — H&P (Signed)
Heather Mcclure is a 30 y.o. G3P0020 at [redacted]w[redacted]d presenting for elevated blood pressure.  Patient notes having single seconds of black spot in her vision today while shopping.  Given her "overthinking things" patient decided to check her blood pressure at the store and noted elevated blood pressure.  Patient has been checking her blood pressure over the past few weeks at the advice of her trainer and has not had any blood pressure elevations until today.  After this episode at the store her blood pressures remain persistently elevated at home and she presented to the office for evaluation.  Patient is known to be anxious but office blood pressures were elevated, not severe.  Patient reports no headache, no chest pain, no shortness of breath no continued scotomata.  Patient does note slight edema.  Patient has no right upper quadrant pain or nausea.   Pt notes rare contractions. Good fetal movement, No vaginal bleeding, not leaking fluid.  [facility] at [facility] since first trimester -Anxiety Resolved low-lying posterior placenta Anemia GERD on Protonix   Prenatal Transfer Tool  Maternal Diabetes: No Genetic Screening: Normal Maternal Ultrasounds/Referrals: Normal Fetal Ultrasounds or other Referrals:  None Maternal Substance Abuse:  No Significant Maternal Medications:  None Significant Maternal Lab Results: None     OB History     Gravida  3   Para      Term      Preterm      AB  2   Living  0      SAB  1   IAB      Ectopic  1   Multiple      Live Births  0          History reviewed. No pertinent past medical history. Past Surgical History:  Procedure Laterality Date   CHROMOPERTUBATION  08/06/2019   Procedure: CHROMOPERTUBATION;  Surgeon: Shea Evans, MD;  Location: [facility];  Service: Gynecology;;   LAPAROSCOPIC UNILATERAL SALPINGECTOMY Right 08/06/2019   Procedure: LAPAROSCOPIC RIGHT SALPINGECTOMY w/ ECTOPIC PREGNANCY;  Surgeon:  Shea Evans, MD;  Location: [facility];  Service: Gynecology;  Laterality: Right;   Family History: family history includes Heart disease in her father. Social History:  reports that she has never smoked. She has never used smokeless tobacco. She reports that she does not drink alcohol and does not use drugs.  Review of Systems - Negative except slight edema     Blood pressure 134/81, pulse 92, unknown if currently breastfeeding.  Physical Exam:  Gen: well appearing, no distress CV: RRR Pulm: CTAB Back: no CVAT Abd: gravid, NT, no RUQ pain LE: 2+ edema, equal bilaterally, non-tender Toco: Irritability FH: baseline 130s, accelerations present, no deceleratons, 10 beat variability  Prenatal labs: ABO, Rh: --/--/A POS Performed at [facility], [address] [place], [place] [address]  [phone] Antibody: Negative Rubella: Immune RPR:   Nonreactive HBsAg:   Negative HIV:   Negative GBS:   Not yet done 1 hr Glucola 111  Genetic screening normal [facility], normal AFP Anatomy US normal  CBC    Component Value Date/Time   WBC 9.0 03/06/2021 1700   RBC 3.55 (L) 03/06/2021 1700   HGB 10.8 (L) 03/06/2021 1700   HCT 32.0 (L) 03/06/2021 1700   PLT 289 03/06/2021 1700   MCV 90.1 03/06/2021 1700   MCH 30.4 03/06/2021 1700   MCHC 33.8 03/06/2021 1700   RDW 13.6 03/06/2021 1700   LYMPHSABS 2.2 06/27/2020  2237   MONOABS 0.6 06/27/2020 2237   EOSABS 0.1 06/27/2020 2237   BASOSABS 0.1 06/27/2020 2237   CMP     Component Value Date/Time   NA 135 03/06/2021 1700   K 3.8 03/06/2021 1700   CL 106 03/06/2021 1700   CO2 18 (L) 03/06/2021 1700   GLUCOSE 74 03/06/2021 1700   BUN 6 03/06/2021 1700   CREATININE 0.48 03/06/2021 1700   CALCIUM 9.3 03/06/2021 1700   PROT 6.3 (L) 03/06/2021 1700   ALBUMIN 2.7 (L) 03/06/2021 1700   AST 23 03/06/2021 1700   ALT 9 03/06/2021 1700   ALKPHOS 135 (H) 03/06/2021 1700   BILITOT 0.3 03/06/2021 1700   GFRNONAA  >60 03/06/2021 1700   GFRAA >60 08/06/2019 1345     Assessment/Plan: 30 y.o. G3P0020 at [redacted]w[redacted]d Preeclampsia, no severe features.  Labs stable other than high proteinuria.  Patient without symptoms of preeclampsia.  Given acute onset and pressures nearing severe range will admit for observation.  We will plan betamethasone now and repeat in 24 hours.  Will start labetalol 100 mg every 8 hours and adjust as needed.  We will plan 3 times daily fetal monitoring and plan fetal growth ultrasound.  We will collect group B strep.  We will repeat labs tomorrow.  We will plan 24-hour urine collection for protein creatinine.  Patient remained stable she will be a candidate for outpatient management with induction of labor at 37 weeks.  Should she progressed to severe range blood pressures, abnormal labs, growth restriction, nonreassuring fetal testing or severe symptoms of preeclampsia may need to proceed with delivery earlier.  Patient is aware of the plan. -Currently fetal status reactive -Anemia.  Start iron.  Lendon Colonel 03/06/2021 8:28 PM     Lendon Colonel 03/06/2021, 8:20 PM

## 2021-03-06 NOTE — MAU Note (Signed)
Pt sent from office for elevated B/P.152/100 and 160/100. Denies any headache or visual changes good fetal movement reported.

## 2021-03-06 NOTE — MAU Provider Note (Signed)
History     CSN: [id number]  Arrival date and time: 03/06/21 1603   Event Date/Time   First Provider Initiated Contact with Patient 03/06/21 1737      Chief Complaint  Patient presents with   Hypertension   HPI  Ms.Heather Mcclure is a 30 y.o. female G3P0020 @ [redacted]w[redacted]d  here in MAU from the office with elevated BP. This is a new problem. This is new onset elevated BP.  She has no history of CHTN.   No headaches, no scotoma. Had "1 second" of black spots this morning, however none again. + fetal movement.   OB History     Gravida  3   Para      Term      Preterm      AB  2   Living  0      SAB  1   IAB      Ectopic  1   Multiple      Live Births  0           History reviewed. No pertinent past medical history.  Past Surgical History:  Procedure Laterality Date   CHROMOPERTUBATION  08/06/2019   Procedure: CHROMOPERTUBATION;  Surgeon: Shea Evans, MD;  Location: [facility];  Service: Gynecology;;   LAPAROSCOPIC UNILATERAL SALPINGECTOMY Right 08/06/2019   Procedure: LAPAROSCOPIC RIGHT SALPINGECTOMY w/ ECTOPIC PREGNANCY;  Surgeon: Shea Evans, MD;  [facility];  Service: Gynecology;  Laterality: Right;    Family History  Problem Relation Age of Onset   Heart disease Father     Social History   Tobacco Use   Smoking status: Never   Smokeless tobacco: Never  Vaping Use   Vaping Use: Never used  Substance Use Topics   Alcohol use: Never   Drug use: Never    Allergies: No Known Allergies  Medications Prior to Admission  Medication Sig Dispense Refill Last Dose   calcium carbonate (OS-CAL) 1250 (500 Ca) MG chewable tablet Chew 1 tablet by mouth daily.      calcium carbonate (TUMS - DOSED IN MG ELEMENTAL CALCIUM) 500 MG chewable tablet Chew 1 tablet by mouth daily.   03/06/2021   magnesium 30 MG tablet Take 30 mg by mouth daily.      Prenatal Vit-Fe Fumarate-FA (PRENATAL VITAMIN) 27-0.8 MG TABS  Take 1 tablet by mouth daily. 30 tablet  03/06/2021   vitamin B-12 (CYANOCOBALAMIN) 500 MCG tablet Take 500 mcg by mouth daily.      acetaminophen (TYLENOL) 500 MG tablet Take 1 tablet (500 mg total) by mouth every 6 (six) hours as needed. 30 tablet 0    famotidine (PEPCID) 20 MG tablet Take 20 mg by mouth 2 (two) times daily.      promethazine (PHENERGAN) 12.5 MG tablet Take 1 tablet (12.5 mg total) by mouth every 6 (six) hours as needed for nausea or vomiting. 12 tablet 0    Results for orders placed or performed during the hospital encounter of 03/06/21 (from the past 48 hour(s))  Protein / creatinine ratio, urine     Status: Abnormal   Collection Time: 03/06/21  4:03 PM  Result Value Ref Range   Creatinine, Urine 56.93 mg/dL   Total Protein, Urine 220 mg/dL    Comment: RESULTS CONFIRMED BY MANUAL DILUTION   Protein Creatinine Ratio 3.86 (H) 0.00 - 0.15 mg/mg[Cre]    Comment: Performed at [facility], [address] [place], [place] [address]  CBC  Status: Abnormal   Collection Time: 03/06/21  5:00 PM  Result Value Ref Range   WBC 9.0 4.0 - 10.5 K/uL   RBC 3.55 (L) 3.87 - 5.11 MIL/uL   Hemoglobin 10.8 (L) 12.0 - 15.0 g/dL   HCT 81.1 (L) 03.1 - 59.4 %   MCV 90.1 80.0 - 100.0 fL   MCH 30.4 26.0 - 34.0 pg   MCHC 33.8 30.0 - 36.0 g/dL   RDW 58.5 92.9 - 24.4 %   Platelets 289 150 - 400 K/uL   nRBC 0.0 0.0 - 0.2 %    Comment: Performed at [facility], [address] [place], [place] [address]  Comprehensive metabolic panel     Status: Abnormal   Collection Time: 03/06/21  5:00 PM  Result Value Ref Range   Sodium 135 135 - 145 mmol/L   Potassium 3.8 3.5 - 5.1 mmol/L   Chloride 106 98 - 111 mmol/L   CO2 18 (L) 22 - 32 mmol/L   Glucose, Bld 74 70 - 99 mg/dL    Comment: Glucose reference range applies only to samples taken after fasting for at least 8 hours.   BUN 6 6 - 20 mg/dL   Creatinine, Ser 8.17 0.44 - 1.00 mg/dL   Calcium 9.3 8.9 - 71.1 mg/dL   Total Protein  6.3 (L) 6.5 - 8.1 g/dL   Albumin 2.7 (L) 3.5 - 5.0 g/dL   AST 23 15 - 41 U/L   ALT 9 0 - 44 U/L   Alkaline Phosphatase 135 (H) 38 - 126 U/L   Total Bilirubin 0.3 0.3 - 1.2 mg/dL   GFR, Estimated >65 >79 mL/min    Comment: (NOTE) Calculated using the CKD-EPI Creatinine Equation (2021)    Anion gap 11 5 - 15    Comment: Performed at [facility], [address], [place], [place] [address]    Review of Systems  Constitutional:  Negative for fever.  Eyes:  Negative for visual disturbance.  Gastrointestinal:  Negative for abdominal pain.  Neurological:  Negative for headaches.  Physical Exam   Blood pressure (!) 149/88, pulse 87, unknown if currently breastfeeding.  Patient Vitals for the past 24 hrs:  BP Pulse  03/06/21 1945 134/81 92  03/06/21 1915 (!) 156/82 80  03/06/21 1900 (!) 155/84 89  03/06/21 1845 (!) 150/87 92  03/06/21 1830 (!) 143/86 84  03/06/21 1815 (!) 141/88 78  03/06/21 1800 131/88 85  03/06/21 1745 137/83 77  03/06/21 1730 135/87 89  03/06/21 1717 (!) 149/88 87  03/06/21 1700 (!) 152/92 81  03/06/21 1645 (!) 158/88 81  03/06/21 1642 (!) 171/95 79     Physical Exam Constitutional:      General: She is not in acute distress.    Appearance: Normal appearance. She is not ill-appearing, toxic-appearing or diaphoretic.  Pulmonary:     Effort: Pulmonary effort is normal.  Musculoskeletal:        General: Normal range of motion.  Skin:    General: Skin is warm.  Neurological:     Mental Status: She is alert and oriented to person, place, and time.     Deep Tendon Reflexes: Reflexes normal.     Comments: Negative clonus    Fetal Tracing: Baseline: 140 bpm Variability: Moderate  Accelerations: 15x15 Decelerations: None Toco:  None  MAU Course  Procedures  MDM  PIH labs collected Labs, BP readings reviewed with Dr. Charlotta Newton who recommends admission for observation.   Patient  meets criteria for preeclampsia without severe features, however some  BP readings are borderline severe.  Dr. Ernestina Penna at bedside to discuss plan of care with patient.   Assessment and Plan   A:  1. Pre-eclampsia, antepartum   2. [redacted] weeks gestation of pregnancy      P:  Admit to [facility] per  Continue BP monitoring   Heather Mcclure, Heather Rutherford, NP 03/06/2021 8:10 PM

## 2021-03-07 ENCOUNTER — Observation Stay (HOSPITAL_BASED_OUTPATIENT_CLINIC_OR_DEPARTMENT_OTHER): Payer: 59

## 2021-03-07 DIAGNOSIS — D649 Anemia, unspecified: Secondary | ICD-10-CM

## 2021-03-07 DIAGNOSIS — O1400 Mild to moderate pre-eclampsia, unspecified trimester: Secondary | ICD-10-CM | POA: Diagnosis not present

## 2021-03-07 DIAGNOSIS — O4443 Low lying placenta NOS or without hemorrhage, third trimester: Secondary | ICD-10-CM

## 2021-03-07 DIAGNOSIS — O99013 Anemia complicating pregnancy, third trimester: Secondary | ICD-10-CM | POA: Diagnosis not present

## 2021-03-07 DIAGNOSIS — O321XX0 Maternal care for breech presentation, not applicable or unspecified: Secondary | ICD-10-CM

## 2021-03-07 DIAGNOSIS — Z3A35 35 weeks gestation of pregnancy: Secondary | ICD-10-CM

## 2021-03-07 LAB — CBC
HCT: 32.4 % — ABNORMAL LOW (ref 36.0–46.0)
Hemoglobin: 10.9 g/dL — ABNORMAL LOW (ref 12.0–15.0)
MCH: 30 pg (ref 26.0–34.0)
MCHC: 33.6 g/dL (ref 30.0–36.0)
MCV: 89.3 fL (ref 80.0–100.0)
Platelets: 258 10*3/uL (ref 150–400)
RBC: 3.63 MIL/uL — ABNORMAL LOW (ref 3.87–5.11)
RDW: 13.8 % (ref 11.5–15.5)
WBC: 8.3 10*3/uL (ref 4.0–10.5)
nRBC: 0 % (ref 0.0–0.2)

## 2021-03-07 LAB — COMPREHENSIVE METABOLIC PANEL
ALT: 9 U/L (ref 0–44)
AST: 23 U/L (ref 15–41)
Albumin: 2.5 g/dL — ABNORMAL LOW (ref 3.5–5.0)
Alkaline Phosphatase: 122 U/L (ref 38–126)
Anion gap: 9 (ref 5–15)
BUN: 6 mg/dL (ref 6–20)
CO2: 19 mmol/L — ABNORMAL LOW (ref 22–32)
Calcium: 9.3 mg/dL (ref 8.9–10.3)
Chloride: 107 mmol/L (ref 98–111)
Creatinine, Ser: 0.56 mg/dL (ref 0.44–1.00)
GFR, Estimated: >60 mL/min (ref >60)
Glucose, Bld: 127 mg/dL — ABNORMAL HIGH (ref 70–99)
Potassium: 4.5 mmol/L (ref 3.5–5.1)
Sodium: 135 mmol/L (ref 135–145)
Total Bilirubin: 0.3 mg/dL (ref 0.3–1.2)
Total Protein: 5.8 g/dL — ABNORMAL LOW (ref 6.5–8.1)

## 2021-03-07 LAB — URIC ACID: Uric Acid, Serum: 5.6 mg/dL (ref 2.5–7.1)

## 2021-03-07 MED ORDER — BUTALBITAL-APAP-CAFFEINE 50-325-40 MG PO TABS
2.0000 | ORAL_TABLET | Freq: Once | ORAL | Status: AC
  Administered 2021-03-07: 2 via ORAL
  Filled 2021-03-07: qty 2

## 2021-03-07 NOTE — Progress Notes (Signed)
Hospital day #2, 35 weeks 4 days with new onset preeclampsia  Subjective: Patient notes sleeping well last night with Ambien.  She was having contractions earlier in the night that she thinks was from dehydration.  These responded to single dose of terbutaline and a 500 cc IV fluid bolus.  Patient notes no contractions this morning.  Patient notes good fetal movement, no leakage of fluid.  Patient does note some blood tinged mucus when she wipes but no vaginal bleeding.  Patient notes intermittent mild headache but does not need any medications for this.  Patient reports no vision change, no right upper quadrant pain, no fundal tenderness.  Patient notes no change to her edema.  Patient was started on labetalol 100 mg every 8 hours and is tolerating that well.  She is in the midst of collecting a 24-hour urine.  She has received 1 of 2 betamethasone shots  Patient is motivated to go home and appears to be very compliance  Objective: Vitals:   03/07/21 0753 03/07/21 0940 03/07/21 1145 03/07/21 1434  BP: 134/80 136/80 122/73 (!) 139/47  Pulse: 80  88 89  Resp: 16  18 18   Temp: 98.1 F (36.7 C)  97.6 F (36.4 C) 98 F (36.7 C)  TempSrc: Oral  Oral Oral  SpO2: 99%  99% 100%  Weight:       General: Well-appearing, no distress Cardiovascular: Regular rate and rhythm Pulmonary: Clear to auscultation bilaterally Abdomen: Gravid, no fundal tenderness, no right upper quadrant pain GU: Deferred Lower extremity: 2+ pitting edema, 2+ DTR, no clonus  Toco: No contractions FH: 120s, positive celebrations, no decelerations, 10 beat variability  CBC Latest Ref Rng & Units 03/07/2021 03/06/2021 01/27/2021  WBC 4.0 - 10.5 K/uL 8.3 9.0 11.3(H)  Hemoglobin 12.0 - 15.0 g/dL 10.9(L) 10.8(L) 10.6(L)  Hematocrit 36.0 - 46.0 % 32.4(L) 32.0(L) 31.6(L)  Platelets 150 - 400 K/uL 258 289 321   CMP Latest Ref Rng & Units 03/07/2021 03/06/2021 01/27/2021  Glucose 70 - 99 mg/dL 03/30/2021) 74 89  BUN 6 - 20 mg/dL 6 6  146(I)  Creatinine 0.44 - 1.00 mg/dL <4(N 9.98 7.21  Sodium 135 - 145 mmol/L 135 135 136  Potassium 3.5 - 5.1 mmol/L 4.5 3.8 3.8  Chloride 98 - 111 mmol/L 107 106 108  CO2 22 - 32 mmol/L 19(L) 18(L) 21(L)  Calcium 8.9 - 10.3 mg/dL 9.3 9.3 9.6  Total Protein 6.5 - 8.1 g/dL 5.87) 6.3(L) 6.6  Total Bilirubin 0.3 - 1.2 mg/dL 0.3 0.3 0.4  Alkaline Phos 38 - 126 U/L 122 135(H) 101  AST 15 - 41 U/L 23 23 20   ALT 0 - 44 U/L 9 9 9    Assessment and plan: 30 year old G3 P0-0-2-0 at 35 weeks and 3 days with new onset preeclampsia, not severe, stable BPs on low-dose labetalol, normal labs, no symptoms.  Patient is safe for outpatient management at this point.  We will plan her second betamethasone shot this evening and patient is safe to go home after her 24-hour urine collection is completed.  Unfortunately her collected urine was discarded last night so her 24-hour collection is not complete till 1 AM.  Patient will stay the night and be discharged home tomorrow.  Patient has outpatient follow-up on Tuesday.  Reactive fetal testing.  03/07/2021 3:45 PM

## 2021-03-07 NOTE — Plan of Care (Signed)
Plan of care room and routine explained to patient and husband.Plan for blood pressure control discussed and they stated comfortable with plan of care.

## 2021-03-08 LAB — PROTEIN, URINE, 24 HOUR
Collection Interval-UPROT: 24 hours
Protein, 24H Urine: 1020 mg/d — ABNORMAL HIGH (ref 50–100)
Protein, Urine: 34 mg/dL
Urine Total Volume-UPROT: 3000 mL

## 2021-03-08 LAB — CREATININE CLEARANCE, URINE, 24 HOUR
Collection Interval-CRCL: 24 hours
Creatinine Clearance: 87 mL/min (ref 75–115)
Creatinine, 24H Ur: 704 mg/d (ref 600–1800)
Creatinine, Urine: 23.45 mg/dL
Urine Total Volume-CRCL: 3000 mL

## 2021-03-08 MED ORDER — LABETALOL HCL 100 MG PO TABS: 100.0000 mg | ORAL_TABLET | Freq: Three times a day (TID) | ORAL | 1 refills | Status: DC

## 2021-03-08 MED ORDER — POLYSACCHARIDE IRON COMPLEX 150 MG PO CAPS: 150.0000 mg | ORAL_CAPSULE | Freq: Every day | ORAL | 2 refills | Status: AC

## 2021-03-08 MED ORDER — DOCUSATE SODIUM 100 MG PO CAPS: 100.0000 mg | ORAL_CAPSULE | Freq: Two times a day (BID) | ORAL | 1 refills | Status: AC

## 2021-03-08 NOTE — Discharge Summary (Signed)
Patient ID: Heather Mcclure MRN: [id number] DOB/AGE: 01/26/1991 30 y.o.  Admit date: 03/06/2021 Discharge date: March 08, 2021  Admission Diagnoses: Preeclampsia [O14.90]  Discharge Diagnoses: Preeclampsia [O14.90]         Discharged Condition: stable  Hospital Course: Patient admitted with elevated blood pressures and intermittent contractions.  She was started on labetalol 100 mg every 8 hours which improved her blood pressures.  Her labs were negative for preeclampsia her protein creatinine ratio was slightly elevated and a 24-hour urine protein returned with 1 g of protein.  She has intermittent headache which is mostly resolved by rest.  On admission she had uterine irritability which was improved with 500 cc IV fluid bolus and single dose of terbutaline.  She received betamethasone x2.  She had an ultrasound with MFM which revealed an adequately grown fetus and she had a BPP of 8 out of 10.  That ultrasound showed breech position but a real-time ultrasound on the day of discharge showed vertex position.  Vertex positioning is consistent with prior ultrasounds, Leopold's and cervical exam.  Fetal testing remained reactive.  Patient remained anxious with intermittent elevated but nonsevere range blood pressures.  Most blood pressures were within normal range on low-dose labetalol.  Consults: None and MFM  Treatments: Rest, fetal monitoring, IV hydration, single dose terbutaline, labetalol, betamethasone, reassurance  Disposition: home   Allergies as of 03/08/2021   No Known Allergies      Medication List     TAKE these medications    acetaminophen 500 MG tablet Commonly known as: TYLENOL Take 1 tablet (500 mg total) by mouth every 6 (six) hours as needed.   calcium carbonate 1250 (500 Ca) MG chewable tablet Commonly known as: OS-CAL Chew 1 tablet by mouth daily.   calcium carbonate 500 MG chewable tablet Commonly known as: TUMS - dosed in mg elemental calcium Chew 1  tablet by mouth daily.   docusate sodium 100 MG capsule Commonly known as: COLACE Take 1 capsule (100 mg total) by mouth 2 (two) times daily.   famotidine 20 MG tablet Commonly known as: PEPCID Take 20 mg by mouth 2 (two) times daily.   iron polysaccharides 150 MG capsule Commonly known as: NIFEREX Take 1 capsule (150 mg total) by mouth daily. Start taking on: March 09, 2021   labetalol 100 MG tablet Commonly known as: NORMODYNE Take 1 tablet (100 mg total) by mouth every 8 (eight) hours.   magnesium 30 MG tablet Take 30 mg by mouth daily.   Prenatal Vitamin 27-0.8 MG Tabs Take 1 tablet by mouth daily.   promethazine 12.5 MG tablet Commonly known as: PHENERGAN Take 1 tablet (12.5 mg total) by mouth every 6 (six) hours as needed for nausea or vomiting.   vitamin B-12 500 MCG tablet Commonly known as: CYANOCOBALAMIN Take 500 mcg by mouth daily.         Signed: Lendon Colonel, MD MD 03/08/2021, 12:03 PM

## 2021-03-08 NOTE — Progress Notes (Signed)
Hospital day #3, 35 weeks 5 days with new onset preeclampsia  Subjective: Patient notes poor sleep despite Ambien due to multiple interruptions through the night.  Patient thinks this is causing headache.  Patient had a headache yesterday that did not respond to Tylenol and only minimal response to Fioricet.  Patient states headache eventually resolved with rest.  No headache this AM.  Patient remains anxious.  Patient noted episode this morning after breakfast where she felt "unsettled".  Patient notes chest pressure and could not further define her symptoms.  She had held her labetalol several hours prior.  Patient states symptoms went away on their own.  Of note during that time her blood pressure was elevated.  No headache this morning.  Never with vision changes.  Never with right upper quadrant pain.  Patient notes no contractions this morning.  Patient notes good fetal movement, no leakage of fluid.   Patient notes no change to her edema.  Patient was started on labetalol 100 mg every 8 hours and is tolerating that well.   24-hour urine for protein is pending.  She has received 1 of 2 betamethasone shots  Patient is motivated to go home and appears to be very compliant  Objective: Vitals:   03/08/21 0745 03/08/21 0853 03/08/21 0936 03/08/21 1036  BP: (!) 145/83 (!) 155/71 (!) 154/78 137/66  Pulse: 73 73 73 78  Resp: 16     Temp: 97.7 F (36.5 C)     TempSrc: Oral     SpO2: 98%     Weight:       General: Well-appearing, no distress Cardiovascular: Regular rate and rhythm Pulmonary: Clear to auscultation bilaterally Abdomen: Gravid, no fundal tenderness, no right upper quadrant pain GU: Deferred Lower extremity: 2+ pitting edema, 2+ DTR, no clonus GU exam on admission: Fingertip, long, vertex -3, mid position, medium consistency  Toco: No contractions FH: 120s, positive celebrations, no decelerations, 10 beat variability  CBC Latest Ref Rng & Units 03/07/2021 03/06/2021 01/27/2021   WBC 4.0 - 10.5 K/uL 8.3 9.0 11.3(H)  Hemoglobin 12.0 - 15.0 g/dL 10.9(L) 10.8(L) 10.6(L)  Hematocrit 36.0 - 46.0 % 32.4(L) 32.0(L) 31.6(L)  Platelets 150 - 400 K/uL 258 289 321   CMP Latest Ref Rng & Units 03/07/2021 03/06/2021 01/27/2021  Glucose 70 - 99 mg/dL 174(B) 74 89  BUN 6 - 20 mg/dL 6 6 <4(W)  Creatinine 9.67 - 1.00 mg/dL 5.91 6.38 4.66  Sodium 135 - 145 mmol/L 135 135 136  Potassium 3.5 - 5.1 mmol/L 4.5 3.8 3.8  Chloride 98 - 111 mmol/L 107 106 108  CO2 22 - 32 mmol/L 19(L) 18(L) 21(L)  Calcium 8.9 - 10.3 mg/dL 9.3 9.3 9.6  Total Protein 6.5 - 8.1 g/dL 5.9(D) 6.3(L) 6.6  Total Bilirubin 0.3 - 1.2 mg/dL 0.3 0.3 0.4  Alkaline Phos 38 - 126 U/L 122 135(H) 101  AST 15 - 41 U/L 23 23 20   ALT 0 - 44 U/L 9 9 9    GBS negative but urine culture in process 24-hour protein with 1 g protein  MFM u/s: BPP 6 out of 8 (-2 for movement but patient then with movement palpable by me later that day, NST reactive so final BPP 8 out of 10) , growth 5 pounds 10 ounces, 255 8 g, 32nd percentile, AFI 11.1, ultrasound reports breech position though I could not verify this based on images I reviewed.  Real-time ultrasound today: Vertex presentation.   Assessment and plan: 31 year old G3 P0-0-2-0 at  35 weeks and 4 days with new onset preeclampsia, not severe, stable BPs but proteinuria on dip and 24-hour collection.  On low-dose labetalol, normal labs (other than urine) no symptoms.  Patient is safe for outpatient management at this point.  S/p BMZ x 2. Anxiety.  Recommend meditation and light exercise to reduce stress.  I suspect her intermittent elevated blood pressures are due to anxiety Patient has outpatient follow-up on Tuesday. Fetal position.  By Leopold's, cervical exam and real-time ultrasound today patient appears to be in the vertex presentation despite MFM ultrasound yesterday noting breech.  Will repeat ultrasound as an outpatient  Reactive fetal testing.  Lendon Colonel 03/08/2021  11:51 AM

## 2021-03-09 ENCOUNTER — Other Ambulatory Visit: Payer: Self-pay

## 2021-03-09 ENCOUNTER — Inpatient Hospital Stay (HOSPITAL_COMMUNITY)
Admission: AD | Admit: 2021-03-09 | Discharge: 2021-03-09 | Disposition: A | Discharge status: Home / Self Care | Payer: 59 | Source: Home / Self Care | Attending: Obstetrics and Gynecology | Admitting: Obstetrics and Gynecology

## 2021-03-09 ENCOUNTER — Encounter (HOSPITAL_COMMUNITY): Payer: Self-pay | Admitting: Obstetrics and Gynecology

## 2021-03-09 ENCOUNTER — Telehealth (HOSPITAL_COMMUNITY): Payer: Self-pay | Admitting: *Deleted

## 2021-03-09 DIAGNOSIS — Z3689 Encounter for other specified antenatal screening: Secondary | ICD-10-CM

## 2021-03-09 DIAGNOSIS — Z79899 Other long term (current) drug therapy: Secondary | ICD-10-CM | POA: Insufficient documentation

## 2021-03-09 DIAGNOSIS — Z8249 Family history of ischemic heart disease and other diseases of the circulatory system: Secondary | ICD-10-CM | POA: Insufficient documentation

## 2021-03-09 DIAGNOSIS — O1403 Mild to moderate pre-eclampsia, third trimester: Secondary | ICD-10-CM | POA: Insufficient documentation

## 2021-03-09 DIAGNOSIS — Z3A35 35 weeks gestation of pregnancy: Secondary | ICD-10-CM

## 2021-03-09 DIAGNOSIS — O1414 Severe pre-eclampsia complicating childbirth: Secondary | ICD-10-CM | POA: Diagnosis not present

## 2021-03-09 DIAGNOSIS — O10913 Unspecified pre-existing hypertension complicating pregnancy, third trimester: Secondary | ICD-10-CM | POA: Insufficient documentation

## 2021-03-09 LAB — CULTURE, BETA STREP (GROUP B ONLY)

## 2021-03-09 LAB — PROTEIN / CREATININE RATIO, URINE
Creatinine, Urine: 36.7 mg/dL
Protein Creatinine Ratio: 2.97 mg/mg{Cre} — ABNORMAL HIGH (ref 0.00–0.15)
Total Protein, Urine: 109 mg/dL

## 2021-03-09 LAB — COMPREHENSIVE METABOLIC PANEL
ALT: 9 U/L (ref 0–44)
AST: 24 U/L (ref 15–41)
Albumin: 2.6 g/dL — ABNORMAL LOW (ref 3.5–5.0)
Alkaline Phosphatase: 132 U/L — ABNORMAL HIGH (ref 38–126)
Anion gap: 9 (ref 5–15)
BUN: 10 mg/dL (ref 6–20)
CO2: 20 mmol/L — ABNORMAL LOW (ref 22–32)
Calcium: 9 mg/dL (ref 8.9–10.3)
Chloride: 107 mmol/L (ref 98–111)
Creatinine, Ser: 0.58 mg/dL (ref 0.44–1.00)
GFR, Estimated: >60 mL/min (ref >60)
Glucose, Bld: 100 mg/dL — ABNORMAL HIGH (ref 70–99)
Potassium: 3.9 mmol/L (ref 3.5–5.1)
Sodium: 136 mmol/L (ref 135–145)
Total Bilirubin: 0.2 mg/dL — ABNORMAL LOW (ref 0.3–1.2)
Total Protein: 5.8 g/dL — ABNORMAL LOW (ref 6.5–8.1)

## 2021-03-09 LAB — CBC
HCT: 30.8 % — ABNORMAL LOW (ref 36.0–46.0)
Hemoglobin: 10.3 g/dL — ABNORMAL LOW (ref 12.0–15.0)
MCH: 30.6 pg (ref 26.0–34.0)
MCHC: 33.4 g/dL (ref 30.0–36.0)
MCV: 91.4 fL (ref 80.0–100.0)
Platelets: 254 10*3/uL (ref 150–400)
RBC: 3.37 MIL/uL — ABNORMAL LOW (ref 3.87–5.11)
RDW: 14 % (ref 11.5–15.5)
WBC: 7.8 10*3/uL (ref 4.0–10.5)
nRBC: 0.3 % — ABNORMAL HIGH (ref 0.0–0.2)

## 2021-03-09 NOTE — MAU Note (Signed)
Was hosp from Fri until Sun  with BP elevation. Heavy chest, heavy head today, went to office, BP was elevated again. Denies HA, visual changes, epigastric pain or increase in swelling.

## 2021-03-09 NOTE — Telephone Encounter (Signed)
Preadmission screen  

## 2021-03-09 NOTE — MAU Provider Note (Signed)
History     CSN: [id number]  Arrival date and time: 03/09/21 1526   Event Date/Time   First Provider Initiated Contact with Patient 03/09/21 1719      Chief Complaint  Patient presents with   Hypertension   HPI Heather Mcclure is a 30 y.o. G3P0020 at [redacted]w[redacted]d who presents to MAU from [facility] for evaluation of near severe range blood pressure in the setting of previously diagnosed Preeclampsia without Severe Features. Patient's blood pressure was 150/90 in the office today. She endorses feeling that her head was "heavy" during that appointment. On arrival to MAU to MAU she denies this heavy head sensation. She also denies headache, visual disturbances, RUQ/epigastric pain, new onset swelling or weight gain. Patient is scheduled for induction on 03/16/2021.  OB History     Gravida  3   Para      Term      Preterm      AB  2   Living  0      SAB  1   IAB      Ectopic  1   Multiple      Live Births  0           History reviewed. No pertinent past medical history.  Past Surgical History:  Procedure Laterality Date   CHROMOPERTUBATION  08/06/2019   Procedure: CHROMOPERTUBATION;  Surgeon: Shea Evans, MD;  Location: [facility];  Service: Gynecology;;   LAPAROSCOPIC UNILATERAL SALPINGECTOMY Right 08/06/2019   Procedure: LAPAROSCOPIC RIGHT SALPINGECTOMY w/ ECTOPIC PREGNANCY;  Surgeon: Shea Evans, MD;  Location: [facility];  Service: Gynecology;  Laterality: Right;    Family History  Problem Relation Age of Onset   Heart disease Father     Social History   Tobacco Use   Smoking status: Never   Smokeless tobacco: Never  Vaping Use   Vaping Use: Never used  Substance Use Topics   Alcohol use: Never   Drug use: Never    Allergies: No Known Allergies  Medications Prior to Admission  Medication Sig Dispense Refill Last Dose   acetaminophen (TYLENOL) 500 MG tablet Take 1 tablet (500 mg total) by mouth every 6  (six) hours as needed. 30 tablet 0 Past Week   docusate sodium (COLACE) 100 MG capsule Take 1 capsule (100 mg total) by mouth 2 (two) times daily. 60 capsule 1 03/09/2021   famotidine (PEPCID) 20 MG tablet Take 20 mg by mouth 2 (two) times daily.   03/08/2021   iron polysaccharides (NIFEREX) 150 MG capsule Take 1 capsule (150 mg total) by mouth daily. 30 capsule 2 03/09/2021   labetalol (NORMODYNE) 100 MG tablet Take 1 tablet (100 mg total) by mouth every 8 (eight) hours. 90 tablet 1 03/09/2021 at 1400   magnesium 30 MG tablet Take 30 mg by mouth daily.   Past Week   Prenatal Vit-Fe Fumarate-FA (PRENATAL VITAMIN) 27-0.8 MG TABS Take 1 tablet by mouth daily. 30 tablet  03/08/2021   promethazine (PHENERGAN) 12.5 MG tablet Take 1 tablet (12.5 mg total) by mouth every 6 (six) hours as needed for nausea or vomiting. 12 tablet 0 03/09/2021   vitamin B-12 (CYANOCOBALAMIN) 500 MCG tablet Take 500 mcg by mouth daily.   Past Week   calcium carbonate (OS-CAL) 1250 (500 Ca) MG chewable tablet Chew 1 tablet by mouth daily.      calcium carbonate (TUMS - DOSED IN MG ELEMENTAL CALCIUM) 500 MG chewable tablet Chew 1 tablet by mouth  daily.       Review of Systems  Eyes:  Negative for visual disturbance.  Cardiovascular:  Negative for palpitations and leg swelling.  Neurological:  Negative for headaches.  All other systems reviewed and are negative. Physical Exam   Blood pressure 137/82, pulse 75, temperature 98.5 F (36.9 C), temperature source Oral, resp. rate 15, height 4\' 11"  (1.499 m), weight 70.3 kg, SpO2 100 %, unknown if currently breastfeeding.  Physical Exam Vitals and nursing note reviewed. Exam conducted with a chaperone present.  Constitutional:      General: She is not in acute distress.    Appearance: Normal appearance. She is not ill-appearing.  Cardiovascular:     Rate and Rhythm: Normal rate and regular rhythm.     Pulses: Normal pulses.     Heart sounds: Normal heart sounds.  Pulmonary:      Effort: Pulmonary effort is normal.     Breath sounds: Normal breath sounds.  Abdominal:     Comments: Gravid  Skin:    Capillary Refill: Capillary refill takes less than 2 seconds.  Neurological:     Mental Status: She is alert and oriented to person, place, and time.  Psychiatric:        Mood and Affect: Mood normal.        Behavior: Behavior normal.        Thought Content: Thought content normal.        Judgment: Judgment normal.    MAU Course  Procedures  --Reactive tracing: baseline 140, mod var, + accels, no decels --Toco: occasional contractions, not felt by patient --Patient declines cervical exam --S/p inpatient evaluation for Preeclampsia, discharged home yesterday on Labetalol 100 mg q 8 hours --Normotensive throughout evaluation in MAU --Patient asymptomatic throughout evaluation in MAU  Orders Placed This Encounter  Procedures   Protein / creatinine ratio, urine   CBC   Comprehensive metabolic panel   Measure blood pressure   Discharge patient   Patient Vitals for the past 24 hrs:  BP Temp Temp src Pulse Resp SpO2 Height Weight  03/09/21 1716 137/82 -- -- 75 15 100 % -- --  03/09/21 1700 133/77 -- -- 78 -- 100 % -- --  03/09/21 1646 127/78 -- -- 78 -- -- -- --  03/09/21 1631 126/75 -- -- 82 -- -- -- --  03/09/21 1616 123/77 -- -- 84 -- -- -- --  03/09/21 1601 128/75 -- -- 83 -- -- -- --  03/09/21 1557 133/79 -- -- 80 -- -- -- --  03/09/21 1536 138/79 98.5 F (36.9 C) Oral 83 17 100 % 4\' 11"  (1.499 m) 70.3 kg   Results for orders placed or performed during the hospital encounter of 03/09/21 (from the past 24 hour(s))  CBC     Status: Abnormal   Collection Time: 03/09/21  3:48 PM  Result Value Ref Range   WBC 7.8 4.0 - 10.5 K/uL   RBC 3.37 (L) 3.87 - 5.11 MIL/uL   Hemoglobin 10.3 (L) 12.0 - 15.0 g/dL   HCT 03/11/21 (L) 03/11/21 - 18.5 %   MCV 91.4 80.0 - 100.0 fL   MCH 30.6 26.0 - 34.0 pg   MCHC 33.4 30.0 - 36.0 g/dL   RDW 63.1 49.7 - 02.6 %   Platelets  254 150 - 400 K/uL   nRBC 0.3 (H) 0.0 - 0.2 %  Comprehensive metabolic panel     Status: Abnormal   Collection Time: 03/09/21  3:48 PM  Result Value Ref  Range   Sodium 136 135 - 145 mmol/L   Potassium 3.9 3.5 - 5.1 mmol/L   Chloride 107 98 - 111 mmol/L   CO2 20 (L) 22 - 32 mmol/L   Glucose, Bld 100 (H) 70 - 99 mg/dL   BUN 10 6 - 20 mg/dL   Creatinine, Ser 2.69 0.44 - 1.00 mg/dL   Calcium 9.0 8.9 - 48.5 mg/dL   Total Protein 5.8 (L) 6.5 - 8.1 g/dL   Albumin 2.6 (L) 3.5 - 5.0 g/dL   AST 24 15 - 41 U/L   ALT 9 0 - 44 U/L   Alkaline Phosphatase 132 (H) 38 - 126 U/L   Total Bilirubin 0.2 (L) 0.3 - 1.2 mg/dL   GFR, Estimated >46 >27 mL/min   Anion gap 9 5 - 15  Protein / creatinine ratio, urine     Status: Abnormal   Collection Time: 03/09/21  4:13 PM  Result Value Ref Range   Creatinine, Urine 36.70 mg/dL   Total Protein, Urine 109 mg/dL   Protein Creatinine Ratio 2.97 (H) 0.00 - 0.15 mg/mg[Cre]   Assessment and Plan  --30 y.o. G3P0020 at [redacted]w[redacted]d  --Reactive tracing --Preeclampsia without Severe Features --Discharge home in stable condition with strict precautions  Calvert Cantor, CNM 03/09/2021, 9:01 PM

## 2021-03-10 ENCOUNTER — Encounter (HOSPITAL_COMMUNITY): Payer: Self-pay | Admitting: *Deleted

## 2021-03-10 ENCOUNTER — Other Ambulatory Visit: Payer: Self-pay | Admitting: Obstetrics & Gynecology

## 2021-03-10 ENCOUNTER — Encounter (HOSPITAL_COMMUNITY): Payer: Self-pay

## 2021-03-12 ENCOUNTER — Encounter (HOSPITAL_COMMUNITY): Payer: Self-pay | Admitting: Obstetrics & Gynecology

## 2021-03-12 ENCOUNTER — Inpatient Hospital Stay (HOSPITAL_COMMUNITY)
Admission: AD | Admit: 2021-03-12 | Discharge: 2021-03-16 | DRG: 787 | Disposition: A | Discharge status: Home / Self Care | Payer: 59 | Attending: Obstetrics & Gynecology | Admitting: Obstetrics & Gynecology

## 2021-03-12 DIAGNOSIS — Z20822 Contact with and (suspected) exposure to covid-19: Secondary | ICD-10-CM | POA: Diagnosis present

## 2021-03-12 DIAGNOSIS — O1414 Severe pre-eclampsia complicating childbirth: Principal | ICD-10-CM | POA: Diagnosis present

## 2021-03-12 DIAGNOSIS — O339 Maternal care for disproportion, unspecified: Secondary | ICD-10-CM | POA: Diagnosis present

## 2021-03-12 DIAGNOSIS — K219 Gastro-esophageal reflux disease without esophagitis: Secondary | ICD-10-CM | POA: Diagnosis present

## 2021-03-12 DIAGNOSIS — Z3A36 36 weeks gestation of pregnancy: Secondary | ICD-10-CM | POA: Diagnosis not present

## 2021-03-12 DIAGNOSIS — O9081 Anemia of the puerperium: Secondary | ICD-10-CM | POA: Diagnosis not present

## 2021-03-12 DIAGNOSIS — O1490 Unspecified pre-eclampsia, unspecified trimester: Secondary | ICD-10-CM | POA: Diagnosis present

## 2021-03-12 DIAGNOSIS — O9902 Anemia complicating childbirth: Secondary | ICD-10-CM | POA: Diagnosis present

## 2021-03-12 DIAGNOSIS — O9962 Diseases of the digestive system complicating childbirth: Secondary | ICD-10-CM | POA: Diagnosis present

## 2021-03-12 DIAGNOSIS — D62 Acute posthemorrhagic anemia: Secondary | ICD-10-CM | POA: Diagnosis not present

## 2021-03-12 DIAGNOSIS — Z3490 Encounter for supervision of normal pregnancy, unspecified, unspecified trimester: Secondary | ICD-10-CM | POA: Diagnosis present

## 2021-03-12 LAB — COMPREHENSIVE METABOLIC PANEL
ALT: 8 U/L (ref 0–44)
AST: 21 U/L (ref 15–41)
Albumin: 2.7 g/dL — ABNORMAL LOW (ref 3.5–5.0)
Alkaline Phosphatase: 142 U/L — ABNORMAL HIGH (ref 38–126)
Anion gap: 9 (ref 5–15)
BUN: 9 mg/dL (ref 6–20)
CO2: 19 mmol/L — ABNORMAL LOW (ref 22–32)
Calcium: 9.5 mg/dL (ref 8.9–10.3)
Chloride: 104 mmol/L (ref 98–111)
Creatinine, Ser: 0.51 mg/dL (ref 0.44–1.00)
GFR, Estimated: >60 mL/min (ref >60)
Glucose, Bld: 94 mg/dL (ref 70–99)
Potassium: 4.4 mmol/L (ref 3.5–5.1)
Sodium: 132 mmol/L — ABNORMAL LOW (ref 135–145)
Total Bilirubin: 0.3 mg/dL (ref 0.3–1.2)
Total Protein: 6.1 g/dL — ABNORMAL LOW (ref 6.5–8.1)

## 2021-03-12 LAB — CBC
HCT: 32.4 % — ABNORMAL LOW (ref 36.0–46.0)
HCT: 32.2 % — ABNORMAL LOW (ref 36.0–46.0)
Hemoglobin: 11.2 g/dL — ABNORMAL LOW (ref 12.0–15.0)
Hemoglobin: 11 g/dL — ABNORMAL LOW (ref 12.0–15.0)
MCH: 30.9 pg (ref 26.0–34.0)
MCH: 30.6 pg (ref 26.0–34.0)
MCHC: 34.6 g/dL (ref 30.0–36.0)
MCHC: 34.2 g/dL (ref 30.0–36.0)
MCV: 89.3 fL (ref 80.0–100.0)
MCV: 89.7 fL (ref 80.0–100.0)
Platelets: 277 10*3/uL (ref 150–400)
Platelets: 247 10*3/uL (ref 150–400)
RBC: 3.63 MIL/uL — ABNORMAL LOW (ref 3.87–5.11)
RBC: 3.59 MIL/uL — ABNORMAL LOW (ref 3.87–5.11)
RDW: 14.1 % (ref 11.5–15.5)
RDW: 14.2 % (ref 11.5–15.5)
WBC: 9.5 10*3/uL (ref 4.0–10.5)
WBC: 8.8 10*3/uL (ref 4.0–10.5)
nRBC: 0 % (ref 0.0–0.2)
nRBC: 0 % (ref 0.0–0.2)

## 2021-03-12 LAB — TYPE AND SCREEN
ABO/RH(D): A POS
Antibody Screen: NEGATIVE

## 2021-03-12 LAB — OB RESULTS CONSOLE GBS: GBS: NEGATIVE

## 2021-03-12 LAB — URIC ACID: Uric Acid, Serum: 5.3 mg/dL (ref 2.5–7.1)

## 2021-03-12 LAB — RESP PANEL BY RT-PCR (FLU A&B, COVID) ARPGX2
Influenza A by PCR: NEGATIVE
Influenza B by PCR: NEGATIVE
SARS Coronavirus 2 by RT PCR: NEGATIVE

## 2021-03-12 MED ORDER — HYDRALAZINE HCL 20 MG/ML IJ SOLN: 10.0000 mg | INTRAMUSCULAR | Status: DC | PRN

## 2021-03-12 MED ORDER — MISOPROSTOL 25 MCG QUARTER TABLET
25.0000 ug | ORAL_TABLET | ORAL | Status: DC
  Administered 2021-03-12: 25 ug via VAGINAL
  Filled 2021-03-12: qty 1

## 2021-03-12 MED ORDER — MISOPROSTOL 50MCG HALF TABLET
50.0000 ug | ORAL_TABLET | ORAL | Status: DC
  Administered 2021-03-12: 50 ug via ORAL
  Filled 2021-03-12: qty 1

## 2021-03-12 MED ORDER — ACETAMINOPHEN 325 MG PO TABS: 650.0000 mg | ORAL_TABLET | ORAL | Status: DC | PRN

## 2021-03-12 MED ORDER — OXYTOCIN-SODIUM CHLORIDE 30-0.9 UT/500ML-% IV SOLN: 1.0000 m[IU]/min | INTRAVENOUS | Status: DC

## 2021-03-12 MED ORDER — LIDOCAINE HCL (PF) 1 % IJ SOLN: 30.0000 mL | INTRAMUSCULAR | Status: DC | PRN

## 2021-03-12 MED ORDER — LACTATED RINGERS IV SOLN: 500.0000 mL | INTRAVENOUS | Status: DC | PRN

## 2021-03-12 MED ORDER — SOD CITRATE-CITRIC ACID 500-334 MG/5ML PO SOLN
30.0000 mL | ORAL | Status: DC | PRN
  Administered 2021-03-13: 30 mL via ORAL

## 2021-03-12 MED ORDER — OXYTOCIN BOLUS FROM INFUSION: 333.0000 mL | Freq: Once | INTRAVENOUS | Status: DC

## 2021-03-12 MED ORDER — LABETALOL HCL 5 MG/ML IV SOLN
20.0000 mg | INTRAVENOUS | Status: DC | PRN
  Administered 2021-03-12 (×2): 20 mg via INTRAVENOUS
  Filled 2021-03-12 (×2): qty 4

## 2021-03-12 MED ORDER — BUTORPHANOL TARTRATE 1 MG/ML IJ SOLN
1.0000 mg | INTRAMUSCULAR | Status: DC | PRN
  Administered 2021-03-12 – 2021-03-13 (×3): 1 mg via INTRAVENOUS
  Filled 2021-03-12 (×3): qty 1

## 2021-03-12 MED ORDER — ONDANSETRON HCL 4 MG/2ML IJ SOLN: 4.0000 mg | Freq: Four times a day (QID) | INTRAMUSCULAR | Status: DC | PRN

## 2021-03-12 MED ORDER — LACTATED RINGERS IV SOLN: INTRAVENOUS | Status: DC

## 2021-03-12 MED ORDER — TERBUTALINE SULFATE 1 MG/ML IJ SOLN: 0.2500 mg | Freq: Once | INTRAMUSCULAR | Status: DC | PRN

## 2021-03-12 MED ORDER — OXYTOCIN-SODIUM CHLORIDE 30-0.9 UT/500ML-% IV SOLN: 2.5000 [IU]/h | INTRAVENOUS | Status: DC

## 2021-03-12 MED ORDER — LABETALOL HCL 5 MG/ML IV SOLN: 80.0000 mg | INTRAVENOUS | Status: DC | PRN

## 2021-03-12 MED ORDER — LABETALOL HCL 5 MG/ML IV SOLN
40.0000 mg | INTRAVENOUS | Status: DC | PRN
Start: 2021-03-12 — End: 2021-03-13

## 2021-03-12 MED ORDER — MISOPROSTOL 50MCG HALF TABLET: 50.0000 ug | ORAL_TABLET | ORAL | Status: DC

## 2021-03-12 NOTE — Progress Notes (Addendum)
1st Cytotec vaginal at 2.10 pm BP 137/85   Pulse 81   Temp 99.2 F (37.3 C) (Oral)   Resp 15   SpO2 100%   Patient Vitals for the past 24 hrs:  BP Temp Temp src Pulse Resp SpO2  03/12/21 1626 137/85 -- -- 81 -- --  03/12/21 1500 (!) 148/94 -- -- 76 -- --  03/12/21 1416 (!) 155/91 -- -- 79 -- --  03/12/21 1400 (!) 140/92 -- -- 78 -- --  03/12/21 1346 (!) 162/90 -- -- 76 -- --  03/12/21 1338 (!) 155/100 -- -- 85 -- --  03/12/21 1321 (!) 168/93 99.2 F (37.3 C) Oral 76 15 100 %    FHT 130s + accels no decels mod variability- cat I  Toco q 5-8 min  Okay light laboring diet  Repeat 2nd Cytotec at 4 hrs as needed  BP stable, no severe range and no antiHTN push Defer magnesium for now

## 2021-03-12 NOTE — H&P (Signed)
Heather Mcclure is a 30 yo G3P0020, at 36.2 wks with Preeclampsia diagnosis since 1 wk and now new onset severe range BPs, admitted for IOL. S/p BMTZ last wk   G1- Ectopic, right salpingectomy after failed MTX x 2 doses G2- 4 wk SAB  G3- current spontaneous preg, PCOS, was on Myoinositol and Metformin combo from [place]  Nl NIPT, few visits for spotting in 2nd trimester, overall all visit per schedule. Some anxiety, no meds, GERD-Protonix. Growth for decr FMs at 32 wks-  4'3" at 35% and AC 40%, Vx. PEC diagnosed at 35.3 wks with BP 160/100 range and needed 48 hr admission, started Labetalol, most BPs in non-severe range and was discharged for outpatient management of mild PEC. She was sent back to MAU for severe BPs in office at 33 wks and was discharged after labs and serial BPs, reports not feelign too well since 1 week some head pressure but no HA/ vision changes since 1st admission and no SOB/ CP since last 3 days  Anxiety ++    OB History     Gravida  3   Para      Term      Preterm      AB  2   Living  0      SAB  1   IAB      Ectopic  1   Multiple      Live Births  0          Past Medical History:  Diagnosis Date   Medical history non-contributory    Pre-eclampsia    Past Surgical History:  Procedure Laterality Date   CHROMOPERTUBATION  08/06/2019   Procedure: CHROMOPERTUBATION;  Surgeon: Shea Evans, MD;  Location: [facility];  Service: Gynecology;;   LAPAROSCOPIC UNILATERAL SALPINGECTOMY Right 08/06/2019   Procedure: LAPAROSCOPIC RIGHT SALPINGECTOMY w/ ECTOPIC PREGNANCY;  Surgeon: Shea Evans, MD;  Location: [facility];  Service: Gynecology;  Laterality: Right;   Family History: family history includes Heart disease in her father. Social History:  reports that she has never smoked. She has never used smokeless tobacco. She reports that she does not drink alcohol and does not use drugs.     Maternal Diabetes:  No Genetic Screening: Normal Maternal Ultrasounds/Referrals: Normal Fetal Ultrasounds or other Referrals:  None Maternal Substance Abuse:  No Significant Maternal Medications:  Labetaolol 100mg  tid since last week since PEC diagnosis x 1 wk. Pantoprazole, Metformin 500mg  + myoinositol in 1st trim. BTMZ 8/19, 8/20  Significant Maternal Lab Results:  Group B Strep negative Other Comments:   Severe preeclampsia   Review of Systems History Dilation: Closed Exam by:: C Goodnight,RNC Blood pressure (!) 148/94, pulse 76, temperature 99.2 F (37.3 C), temperature source Oral, resp. rate 15, SpO2 100 %, unknown if currently breastfeeding. Exam Physical Exam  BP (!) 148/94   Pulse 76   Temp 99.2 F (37.3 C) (Oral)   Resp 15   SpO2 100%   Patient Vitals for the past 24 hrs:  BP Temp Temp src Pulse Resp SpO2  03/12/21 1500 (!) 148/94 -- -- 76 -- --  03/12/21 1416 (!) 155/91 -- -- 79 -- --  03/12/21 1400 (!) 140/92 -- -- 78 -- --  03/12/21 1346 (!) 162/90 -- -- 76 -- --  03/12/21 1338 (!) 155/100 -- -- 85 -- --  03/12/21 1321 (!) 168/93 99.2 F (37.3 C) Oral 76 15 100 %    Physical exam:  A&O x  3, no acute distress. Pleasant HEENT neg, no thyromegaly Lungs CTA bilat CV RRR, S1S2 normal Abdo soft, non tender, non acute Gravid, Vx  Extr no edema/ tenderness DTR +2/+2 Pelvic Cx closed, soft/, mid/ Vx/ -4 FHT  145-150s mod variability/ + accels no decels - cat I Toco  irreg    Prenatal labs: ABO, Rh: --/--/A POS (08/25 1317) Antibody: NEG (08/25 1317) Rubella: Immune (02/23 0000) RPR: Nonreactive (02/23 0000)  HBsAg: Negative (02/23 0000)  HIV: Non-reactive (02/23 0000)  GBS:   Negative  NIPT low risk  AFP1 Negative    Assessment/Plan: 30 yo G3P0020, at 36.2 wks with Preeclampsia since 1 wk and now new onset severe range BPs, admitted for IOL. S/p BMTZ last wk  EFW 6 lbs, borderline pelvis.  Cytotec 25 mcg vag q 4 hrs and then pitocin as needed Epidural in active labor   BP close monitoring, no neural symptoms, plan Magnesium PP and in labor if severe range BPs persist. AntiHTN protocol as needed   Robley Fries 03/12/2021, 3:33 PM

## 2021-03-13 ENCOUNTER — Encounter (HOSPITAL_COMMUNITY)
Admission: AD | Disposition: A | Discharge status: Home / Self Care | Payer: Self-pay | Source: Home / Self Care | Attending: Obstetrics & Gynecology

## 2021-03-13 ENCOUNTER — Encounter (HOSPITAL_COMMUNITY): Payer: Self-pay | Admitting: Obstetrics & Gynecology

## 2021-03-13 ENCOUNTER — Inpatient Hospital Stay (HOSPITAL_COMMUNITY): Payer: 59 | Admitting: Anesthesiology

## 2021-03-13 DIAGNOSIS — O1414 Severe pre-eclampsia complicating childbirth: Secondary | ICD-10-CM | POA: Diagnosis present

## 2021-03-13 LAB — RPR: RPR Ser Ql: NONREACTIVE

## 2021-03-13 LAB — CBC
HCT: 32.1 % — ABNORMAL LOW (ref 36.0–46.0)
Hemoglobin: 10.5 g/dL — ABNORMAL LOW (ref 12.0–15.0)
MCH: 30.2 pg (ref 26.0–34.0)
MCHC: 32.7 g/dL (ref 30.0–36.0)
MCV: 92.2 fL (ref 80.0–100.0)
Platelets: 252 10*3/uL (ref 150–400)
RBC: 3.48 MIL/uL — ABNORMAL LOW (ref 3.87–5.11)
RDW: 14.2 % (ref 11.5–15.5)
WBC: 17.1 10*3/uL — ABNORMAL HIGH (ref 4.0–10.5)
nRBC: 0 % (ref 0.0–0.2)

## 2021-03-13 SURGERY — Surgical Case
Anesthesia: Epidural

## 2021-03-13 MED ORDER — FENTANYL-BUPIVACAINE-NACL 0.5-0.125-0.9 MG/250ML-% EP SOLN
12.0000 mL/h | EPIDURAL | Status: DC | PRN
  Administered 2021-03-13: 12 mL/h via EPIDURAL
  Filled 2021-03-13: qty 250

## 2021-03-13 MED ORDER — ZOLPIDEM TARTRATE 5 MG PO TABS: 5.0000 mg | ORAL_TABLET | Freq: Every evening | ORAL | Status: DC | PRN

## 2021-03-13 MED ORDER — DOCUSATE SODIUM 100 MG PO CAPS
100.0000 mg | ORAL_CAPSULE | Freq: Two times a day (BID) | ORAL | Status: DC
  Administered 2021-03-13 – 2021-03-16 (×6): 100 mg via ORAL
  Filled 2021-03-13 (×6): qty 1

## 2021-03-13 MED ORDER — DEXMEDETOMIDINE (PRECEDEX) IN NS 20 MCG/5ML (4 MCG/ML) IV SYRINGE
PREFILLED_SYRINGE | INTRAVENOUS | Status: DC | PRN
  Administered 2021-03-13: 4 ug via INTRAVENOUS

## 2021-03-13 MED ORDER — PRENATAL MULTIVITAMIN CH
1.0000 | ORAL_TABLET | Freq: Every day | ORAL | Status: DC
  Administered 2021-03-14 – 2021-03-16 (×3): 1 via ORAL
  Filled 2021-03-13 (×3): qty 1

## 2021-03-13 MED ORDER — FENTANYL CITRATE (PF) 100 MCG/2ML IJ SOLN
INTRAMUSCULAR | Status: DC | PRN
  Administered 2021-03-13 (×2): 50 ug via INTRAVENOUS

## 2021-03-13 MED ORDER — MEPERIDINE HCL 25 MG/ML IJ SOLN: 6.2500 mg | INTRAMUSCULAR | Status: DC | PRN

## 2021-03-13 MED ORDER — METOCLOPRAMIDE HCL 5 MG/ML IJ SOLN
INTRAMUSCULAR | Status: AC
  Filled 2021-03-13: qty 2

## 2021-03-13 MED ORDER — OXYTOCIN-SODIUM CHLORIDE 30-0.9 UT/500ML-% IV SOLN
INTRAVENOUS | Status: AC
  Filled 2021-03-13: qty 500

## 2021-03-13 MED ORDER — NALBUPHINE HCL 10 MG/ML IJ SOLN: 5.0000 mg | INTRAMUSCULAR | Status: DC | PRN

## 2021-03-13 MED ORDER — MEPERIDINE HCL 25 MG/ML IJ SOLN
INTRAMUSCULAR | Status: DC | PRN
  Administered 2021-03-13 (×2): 12.5 mg via INTRAVENOUS

## 2021-03-13 MED ORDER — DIPHENHYDRAMINE HCL 50 MG/ML IJ SOLN: 12.5000 mg | INTRAMUSCULAR | Status: DC | PRN

## 2021-03-13 MED ORDER — NIFEDIPINE ER OSMOTIC RELEASE 30 MG PO TB24
30.0000 mg | ORAL_TABLET | Freq: Every day | ORAL | Status: DC
  Administered 2021-03-13 – 2021-03-16 (×4): 30 mg via ORAL
  Filled 2021-03-13 (×4): qty 1

## 2021-03-13 MED ORDER — OXYCODONE HCL 5 MG/5ML PO SOLN
5.0000 mg | Freq: Once | ORAL | Status: DC | PRN
Start: 2021-03-13 — End: 2021-03-13

## 2021-03-13 MED ORDER — MENTHOL 3 MG MT LOZG: 1.0000 | LOZENGE | OROMUCOSAL | Status: DC | PRN

## 2021-03-13 MED ORDER — NALBUPHINE HCL 10 MG/ML IJ SOLN: 5.0000 mg | Freq: Once | INTRAMUSCULAR | Status: DC | PRN

## 2021-03-13 MED ORDER — LIDOCAINE HCL (PF) 1 % IJ SOLN
INTRAMUSCULAR | Status: DC | PRN
  Administered 2021-03-13: 5 mL via EPIDURAL
  Administered 2021-03-13: 3 mL via EPIDURAL

## 2021-03-13 MED ORDER — POVIDONE-IODINE 10 % EX SWAB: 2.0000 "application " | Freq: Once | CUTANEOUS | Status: DC

## 2021-03-13 MED ORDER — METOCLOPRAMIDE HCL 5 MG/ML IJ SOLN
INTRAMUSCULAR | Status: DC | PRN
  Administered 2021-03-13: 10 mg via INTRAVENOUS

## 2021-03-13 MED ORDER — TETANUS-DIPHTH-ACELL PERTUSSIS 5-2.5-18.5 LF-MCG/0.5 IM SUSY: 0.5000 mL | PREFILLED_SYRINGE | Freq: Once | INTRAMUSCULAR | Status: DC

## 2021-03-13 MED ORDER — MAGNESIUM OXIDE -MG SUPPLEMENT 400 (240 MG) MG PO TABS
400.0000 mg | ORAL_TABLET | Freq: Every day | ORAL | Status: DC
  Administered 2021-03-14 – 2021-03-16 (×3): 400 mg via ORAL
  Filled 2021-03-13 (×3): qty 1

## 2021-03-13 MED ORDER — ACETAMINOPHEN 325 MG PO TABS
650.0000 mg | ORAL_TABLET | ORAL | Status: DC | PRN
  Administered 2021-03-14: 650 mg via ORAL
  Filled 2021-03-13: qty 2

## 2021-03-13 MED ORDER — NALOXONE HCL 0.4 MG/ML IJ SOLN: 0.4000 mg | INTRAMUSCULAR | Status: DC | PRN

## 2021-03-13 MED ORDER — COCONUT OIL OIL: 1.0000 "application " | TOPICAL_OIL | Status: DC | PRN

## 2021-03-13 MED ORDER — SIMETHICONE 80 MG PO CHEW: 80.0000 mg | CHEWABLE_TABLET | ORAL | Status: DC | PRN

## 2021-03-13 MED ORDER — OXYTOCIN-SODIUM CHLORIDE 30-0.9 UT/500ML-% IV SOLN
1.0000 m[IU]/min | INTRAVENOUS | Status: DC
  Administered 2021-03-13: 2 m[IU]/min via INTRAVENOUS
  Filled 2021-03-13: qty 500

## 2021-03-13 MED ORDER — OXYCODONE HCL 5 MG PO TABS
5.0000 mg | ORAL_TABLET | ORAL | Status: DC | PRN
  Administered 2021-03-14: 10 mg via ORAL
  Administered 2021-03-14: 5 mg via ORAL
  Filled 2021-03-13: qty 1
  Filled 2021-03-13: qty 2

## 2021-03-13 MED ORDER — MAGNESIUM SULFATE 40 GM/1000ML IV SOLN
1.0000 g/h | INTRAVENOUS | Status: AC
  Administered 2021-03-14: 2 g/h via INTRAVENOUS
  Filled 2021-03-13: qty 1000

## 2021-03-13 MED ORDER — PROMETHAZINE HCL 25 MG/ML IJ SOLN: 6.2500 mg | INTRAMUSCULAR | Status: DC | PRN

## 2021-03-13 MED ORDER — MEPERIDINE HCL 25 MG/ML IJ SOLN
INTRAMUSCULAR | Status: AC
  Filled 2021-03-13: qty 1

## 2021-03-13 MED ORDER — KETOROLAC TROMETHAMINE 30 MG/ML IJ SOLN: 30.0000 mg | Freq: Four times a day (QID) | INTRAMUSCULAR | Status: AC | PRN

## 2021-03-13 MED ORDER — FENTANYL CITRATE (PF) 100 MCG/2ML IJ SOLN
INTRAMUSCULAR | Status: AC
  Filled 2021-03-13: qty 2

## 2021-03-13 MED ORDER — CEFAZOLIN SODIUM-DEXTROSE 2-4 GM/100ML-% IV SOLN: 2.0000 g | INTRAVENOUS | Status: DC

## 2021-03-13 MED ORDER — DEXAMETHASONE SODIUM PHOSPHATE 4 MG/ML IJ SOLN
INTRAMUSCULAR | Status: DC | PRN
  Administered 2021-03-13: 8 mg via INTRAVENOUS

## 2021-03-13 MED ORDER — DEXAMETHASONE SODIUM PHOSPHATE 4 MG/ML IJ SOLN
INTRAMUSCULAR | Status: AC
  Filled 2021-03-13: qty 1

## 2021-03-13 MED ORDER — MORPHINE SULFATE (PF) 0.5 MG/ML IJ SOLN
INTRAMUSCULAR | Status: AC
  Filled 2021-03-13: qty 10

## 2021-03-13 MED ORDER — ONDANSETRON HCL 4 MG/2ML IJ SOLN
INTRAMUSCULAR | Status: AC
  Filled 2021-03-13: qty 2

## 2021-03-13 MED ORDER — LACTATED RINGERS IV SOLN: 500.0000 mL | Freq: Once | INTRAVENOUS | Status: DC

## 2021-03-13 MED ORDER — LABETALOL HCL 100 MG PO TABS
100.0000 mg | ORAL_TABLET | Freq: Three times a day (TID) | ORAL | Status: DC
  Administered 2021-03-13 – 2021-03-16 (×7): 100 mg via ORAL
  Filled 2021-03-13 (×8): qty 1

## 2021-03-13 MED ORDER — SENNOSIDES-DOCUSATE SODIUM 8.6-50 MG PO TABS
2.0000 | ORAL_TABLET | Freq: Every day | ORAL | Status: DC
  Administered 2021-03-14 – 2021-03-16 (×3): 2 via ORAL
  Filled 2021-03-13 (×3): qty 2

## 2021-03-13 MED ORDER — TERBUTALINE SULFATE 1 MG/ML IJ SOLN: 0.2500 mg | Freq: Once | INTRAMUSCULAR | Status: DC | PRN

## 2021-03-13 MED ORDER — OXYCODONE HCL 5 MG PO TABS
5.0000 mg | ORAL_TABLET | Freq: Once | ORAL | Status: DC | PRN
Start: 2021-03-13 — End: 2021-03-13

## 2021-03-13 MED ORDER — OXYTOCIN-SODIUM CHLORIDE 30-0.9 UT/500ML-% IV SOLN
2.5000 [IU]/h | INTRAVENOUS | Status: AC
  Administered 2021-03-13: 2.5 [IU]/h via INTRAVENOUS
  Filled 2021-03-13: qty 500

## 2021-03-13 MED ORDER — POLYSACCHARIDE IRON COMPLEX 150 MG PO CAPS
150.0000 mg | ORAL_CAPSULE | Freq: Every day | ORAL | Status: DC
  Administered 2021-03-14 – 2021-03-16 (×3): 150 mg via ORAL
  Filled 2021-03-13 (×3): qty 1

## 2021-03-13 MED ORDER — EPHEDRINE 5 MG/ML INJ: 10.0000 mg | INTRAVENOUS | Status: DC | PRN

## 2021-03-13 MED ORDER — SODIUM BICARBONATE 8.4 % IV SOLN
INTRAVENOUS | Status: DC | PRN
  Administered 2021-03-13: 10 mL via EPIDURAL
  Administered 2021-03-13: 5 mL via EPIDURAL
  Administered 2021-03-13: 1 mL via EPIDURAL

## 2021-03-13 MED ORDER — SIMETHICONE 80 MG PO CHEW
80.0000 mg | CHEWABLE_TABLET | Freq: Three times a day (TID) | ORAL | Status: DC
  Administered 2021-03-13 – 2021-03-16 (×9): 80 mg via ORAL
  Filled 2021-03-13 (×9): qty 1

## 2021-03-13 MED ORDER — FENTANYL CITRATE (PF) 100 MCG/2ML IJ SOLN: 25.0000 ug | INTRAMUSCULAR | Status: DC | PRN

## 2021-03-13 MED ORDER — DIBUCAINE (PERIANAL) 1 % EX OINT: 1.0000 "application " | TOPICAL_OINTMENT | CUTANEOUS | Status: DC | PRN

## 2021-03-13 MED ORDER — SODIUM CHLORIDE 0.9% FLUSH
3.0000 mL | INTRAVENOUS | Status: DC | PRN
  Administered 2021-03-14: 3 mL via INTRAVENOUS

## 2021-03-13 MED ORDER — LACTATED RINGERS IV SOLN: INTRAVENOUS | Status: DC

## 2021-03-13 MED ORDER — MAGNESIUM SULFATE 40 GM/1000ML IV SOLN
INTRAVENOUS | Status: AC
  Filled 2021-03-13: qty 1000

## 2021-03-13 MED ORDER — KETOROLAC TROMETHAMINE 30 MG/ML IJ SOLN
30.0000 mg | Freq: Four times a day (QID) | INTRAMUSCULAR | Status: AC | PRN
  Administered 2021-03-13: 30 mg via INTRAVENOUS

## 2021-03-13 MED ORDER — DIPHENHYDRAMINE HCL 25 MG PO CAPS
25.0000 mg | ORAL_CAPSULE | ORAL | Status: DC | PRN
Start: 2021-03-13 — End: 2021-03-15

## 2021-03-13 MED ORDER — CEFAZOLIN SODIUM-DEXTROSE 2-3 GM-%(50ML) IV SOLR
INTRAVENOUS | Status: DC | PRN
  Administered 2021-03-13: 2 g via INTRAVENOUS

## 2021-03-13 MED ORDER — MAGNESIUM SULFATE BOLUS VIA INFUSION
4.0000 g | Freq: Once | INTRAVENOUS | Status: AC
  Administered 2021-03-13: 4 g via INTRAVENOUS
  Filled 2021-03-13: qty 1000

## 2021-03-13 MED ORDER — IBUPROFEN 600 MG PO TABS
600.0000 mg | ORAL_TABLET | Freq: Four times a day (QID) | ORAL | Status: DC
  Administered 2021-03-14 – 2021-03-16 (×7): 600 mg via ORAL
  Filled 2021-03-13 (×7): qty 1

## 2021-03-13 MED ORDER — WITCH HAZEL-GLYCERIN EX PADS: 1.0000 "application " | MEDICATED_PAD | CUTANEOUS | Status: DC | PRN

## 2021-03-13 MED ORDER — EPHEDRINE 5 MG/ML INJ
10.0000 mg | INTRAVENOUS | Status: DC | PRN
Start: 2021-03-13 — End: 2021-03-13

## 2021-03-13 MED ORDER — OXYTOCIN-SODIUM CHLORIDE 30-0.9 UT/500ML-% IV SOLN
INTRAVENOUS | Status: DC | PRN
  Administered 2021-03-13: 300 mL via INTRAVENOUS

## 2021-03-13 MED ORDER — PHENYLEPHRINE 40 MCG/ML (10ML) SYRINGE FOR IV PUSH (FOR BLOOD PRESSURE SUPPORT): 80.0000 ug | PREFILLED_SYRINGE | INTRAVENOUS | Status: DC | PRN

## 2021-03-13 MED ORDER — BUPIVACAINE HCL (PF) 0.25 % IJ SOLN
INTRAMUSCULAR | Status: DC | PRN
  Administered 2021-03-13: 8 mL via EPIDURAL

## 2021-03-13 MED ORDER — SCOPOLAMINE 1 MG/3DAYS TD PT72: 1.0000 | MEDICATED_PATCH | Freq: Once | TRANSDERMAL | Status: DC

## 2021-03-13 MED ORDER — SODIUM CHLORIDE 0.9 % IV SOLN: 500.0000 mg | INTRAVENOUS | Status: DC

## 2021-03-13 MED ORDER — SOD CITRATE-CITRIC ACID 500-334 MG/5ML PO SOLN
ORAL | Status: AC
  Filled 2021-03-13: qty 30

## 2021-03-13 MED ORDER — MORPHINE SULFATE (PF) 0.5 MG/ML IJ SOLN
INTRAMUSCULAR | Status: DC | PRN
  Administered 2021-03-13: 3 mg via EPIDURAL

## 2021-03-13 MED ORDER — ONDANSETRON HCL 4 MG/2ML IJ SOLN
INTRAMUSCULAR | Status: DC | PRN
  Administered 2021-03-13: 4 mg via INTRAVENOUS

## 2021-03-13 MED ORDER — NALOXONE HCL 4 MG/10ML IJ SOLN
1.0000 ug/kg/h | INTRAVENOUS | Status: DC | PRN
  Filled 2021-03-13: qty 5

## 2021-03-13 MED ORDER — DIPHENHYDRAMINE HCL 25 MG PO CAPS: 25.0000 mg | ORAL_CAPSULE | Freq: Four times a day (QID) | ORAL | Status: DC | PRN

## 2021-03-13 MED ORDER — ONDANSETRON HCL 4 MG/2ML IJ SOLN: 4.0000 mg | Freq: Three times a day (TID) | INTRAMUSCULAR | Status: DC | PRN

## 2021-03-13 MED ORDER — KETOROLAC TROMETHAMINE 30 MG/ML IJ SOLN
30.0000 mg | Freq: Four times a day (QID) | INTRAMUSCULAR | Status: AC
  Administered 2021-03-13 – 2021-03-14 (×3): 30 mg via INTRAVENOUS
  Filled 2021-03-13 (×3): qty 1

## 2021-03-13 MED ORDER — FAMOTIDINE 20 MG PO TABS
20.0000 mg | ORAL_TABLET | Freq: Two times a day (BID) | ORAL | Status: DC
  Administered 2021-03-13 – 2021-03-16 (×6): 20 mg via ORAL
  Filled 2021-03-13 (×6): qty 1

## 2021-03-13 MED ORDER — LACTATED RINGERS IV SOLN: INTRAVENOUS | Status: DC | PRN

## 2021-03-13 SURGICAL SUPPLY — 36 items
BENZOIN TINCTURE PRP APPL 2/3 (GAUZE/BANDAGES/DRESSINGS) ×2 IMPLANT
CHLORAPREP W/TINT 26ML (MISCELLANEOUS) ×2 IMPLANT
CLAMP CORD UMBIL (MISCELLANEOUS) IMPLANT
CLOTH BEACON ORANGE TIMEOUT ST (SAFETY) ×2 IMPLANT
DRSG OPSITE POSTOP 4X10 (GAUZE/BANDAGES/DRESSINGS) ×2 IMPLANT
ELECT REM PT RETURN 9FT ADLT (ELECTROSURGICAL) ×2
ELECTRODE REM PT RTRN 9FT ADLT (ELECTROSURGICAL) ×1 IMPLANT
EXTRACTOR VACUUM KIWI (MISCELLANEOUS) IMPLANT
EXTRACTOR VACUUM M CUP 4 TUBE (SUCTIONS) IMPLANT
GLOVE BIO SURGEON STRL SZ7 (GLOVE) ×2 IMPLANT
GLOVE BIOGEL PI IND STRL 7.0 (GLOVE) ×2 IMPLANT
GLOVE BIOGEL PI INDICATOR 7.0 (GLOVE) ×2
GOWN STRL REUS W/TWL LRG LVL3 (GOWN DISPOSABLE) ×4 IMPLANT
KIT ABG SYR 3ML LUER SLIP (SYRINGE) IMPLANT
NEEDLE HYPO 25X5/8 SAFETYGLIDE (NEEDLE) IMPLANT
NS IRRIG 1000ML POUR BTL (IV SOLUTION) ×2 IMPLANT
PACK C SECTION WH (CUSTOM PROCEDURE TRAY) ×2 IMPLANT
PAD ABD DERMACEA PRESS 5X9 (GAUZE/BANDAGES/DRESSINGS) ×2 IMPLANT
PAD OB MATERNITY 4.3X12.25 (PERSONAL CARE ITEMS) ×2 IMPLANT
RTRCTR C-SECT PINK 25CM LRG (MISCELLANEOUS) IMPLANT
STRIP CLOSURE SKIN 1/2X4 (GAUZE/BANDAGES/DRESSINGS) IMPLANT
STRIP SURGICAL 1/4 X 6 IN (GAUZE/BANDAGES/DRESSINGS) ×2 IMPLANT
SUT MNCRL 0 VIOLET CTX 36 (SUTURE) ×2 IMPLANT
SUT MONOCRYL 0 CTX 36 (SUTURE) ×2
SUT PLAIN 0 NONE (SUTURE) IMPLANT
SUT PLAIN 2 0 (SUTURE)
SUT PLAIN ABS 2-0 CT1 27XMFL (SUTURE) IMPLANT
SUT VIC AB 0 CT1 27 (SUTURE) ×2
SUT VIC AB 0 CT1 27XBRD ANBCTR (SUTURE) ×2 IMPLANT
SUT VIC AB 2-0 CT1 27 (SUTURE) ×1
SUT VIC AB 2-0 CT1 TAPERPNT 27 (SUTURE) ×1 IMPLANT
SUT VIC AB 4-0 KS 27 (SUTURE) ×2 IMPLANT
SUT VICRYL 0 TIES 12 18 (SUTURE) IMPLANT
TOWEL OR 17X24 6PK STRL BLUE (TOWEL DISPOSABLE) ×2 IMPLANT
TRAY FOLEY W/BAG SLVR 14FR LF (SET/KITS/TRAYS/PACK) IMPLANT
WATER STERILE IRR 1000ML POUR (IV SOLUTION) ×2 IMPLANT

## 2021-03-13 NOTE — Anesthesia Postprocedure Evaluation (Signed)
Anesthesia Post Note  Patient: Heather Mcclure  Procedure(s) Performed: CESAREAN SECTION     Patient location during evaluation: PACU Anesthesia Type: Epidural Level of consciousness: awake and alert Pain management: pain level controlled Vital Signs Assessment: post-procedure vital signs reviewed and stable Respiratory status: spontaneous breathing, respiratory function stable and nonlabored ventilation Cardiovascular status: blood pressure returned to baseline Postop Assessment: epidural receding and no apparent nausea or vomiting Anesthetic complications: no   No notable events documented.  Last Vitals:  Vitals:   03/13/21 1530 03/13/21 1553  BP: (!) 145/87 139/85  Pulse: 85 84  Resp: 19 17  Temp:  37.1 C  SpO2: 96% 98%    Last Pain:  Vitals:   03/13/21 1553  TempSrc: Oral  PainSc: 0-No pain   Pain Goal:                Epidural/Spinal Function Cutaneous sensation: Tingles (03/13/21 1553), Patient able to flex knees: Yes (03/13/21 1553), Patient able to lift hips off bed: Yes (03/13/21 1553), Back pain beyond tenderness at insertion site: No (03/13/21 1553), Progressively worsening motor and/or sensory loss: No (03/13/21 1553), Bowel and/or bladder incontinence post epidural: No (03/13/21 1553)  Beryle Lathe

## 2021-03-13 NOTE — Op Note (Signed)
03/13/2021  Cesarean Section Procedure Note   Heather Mcclure  Procedure: Emergency Primary Low transverse Cesarean section   Indications: IOL for severe preeclampsia, 36.3 wks.  Arrest of descent in second stage. Occiput posterior, inadequate pelvis, cephalopelvic disproportion.     Pre-operative Diagnosis: Arrest of descent in Stage II. OP position. CPD. Severe Preeclampsia   Post-operative Diagnosis: Same   Surgeon: Shea Evans, MD  Assistants: Arlan Organ. CNM   Anesthesia: epidural   Procedure Details:  The patient was seen in the Labor Room, she had been pushing without any change in station from +1 for over an hour and declined further pushing and requested C-section. Exam consistent with CPD/ inadequate pelvic, OP position and caput at +1. C-section delivery discussed. The risks, benefits, complications, treatment options, and expected outcomes were discussed with the patient. The patient concurred with the proposed plan, giving informed consent. identified as Heather Mcclure and the procedure verified as C-Section Delivery.  In OR, A Time Out was held and the above information confirmed. 2 gm Ancef and 500mg  Azithromycin started. After induction of epidural anesthesia, the patient was draped and prepped in the usual sterile manner, foley was draining urine well.  A pfannenstiel incision was made and carried down through the subcutaneous tissue to the fascia. Fascial incision was made and extended transversely. The fascia was separated from the underlying rectus tissue superiorly and inferiorly. The peritoneum was identified and entered. Peritoneal incision was extended longitudinally. Alexis-O retractor placed. The utero-vesical peritoneal reflection was incised transversely and the bladder flap was bluntly freed from the lower uterine segment. RN assisted by elevating the head from vaginal route. A low transverse uterine incision was made. Baby was in OP position with  hyperextended head and RN Derrill Kay elevated the head from posterior side of vagina to achieve good elevation, now I was able to go under the head, flex it and rotate and deliver baby cephalic at 1.19 pm. Female infant with good cry. Apgar scores of 8 at one minute and 9 at five minutes. Delayed cord clamping done at 1 minute and baby handed to NICU team in attendance. Cord ph was not sent. Cord blood was obtained for evaluation. The placenta was removed Intact and appeared normal. The uterine outline, tubes and ovaries appeared normal}. The uterine incision was closed with running locked sutures of 0 Monocryl.  A second imbricating layer sutured.   Hemostasis was observed. Alexis retractor removed. Peritoneal closure done with 2-0 Vicryl.  The fascia was then reapproximated with running sutures of 0Vicryl. The subcuticular closure was performed using 2-0plain gut. The skin was closed with 4-0Vicryl. Steristrips/honeycomb dressing placed.  Instrument, sponge, and needle counts were correct prior the abdominal closure and were correct at the conclusion of the case.   Findings: Female infant in mid pelvis, cephalic OP with hyperextended head. Delivery achieved by RN assisting with head elevation per vagina. Cephalic delivery from [facility] hysterotomy. Apgars 8 and 9. Preterm baby, stable.    Estimated Blood Loss: 350 cc   Total IV Fluids 1000 cc LR  Urine Output:  300CC OF clear urine  Specimens: cord blood and placenta   Complications: no complications  Disposition: PACU - hemodynamically stable.   Maternal Condition: stable   Baby condition / location:  Couplet care / Skin to Skin  Attending Attestation: I performed the procedure.   Signed: Surgeon(s): [facility], MD

## 2021-03-13 NOTE — Progress Notes (Signed)
Patient c/o back pain, poor maternal effort with pushing

## 2021-03-13 NOTE — Transfer of Care (Signed)
Immediate Anesthesia Transfer of Care Note  Patient: Heather Mcclure  Procedure(s) Performed: CESAREAN SECTION  Patient Location: PACU  Anesthesia Type:Epidural  Level of Consciousness: awake, alert  and oriented  Airway & Oxygen Therapy: Patient Spontanous Breathing  Post-op Assessment: Report given to RN and Post -op Vital signs reviewed and stable  Post vital signs: Reviewed and stable  Last Vitals:  Vitals Value Taken Time  BP    Temp    Pulse 97 03/13/21 1416  Resp 21 03/13/21 1416  SpO2 93 % 03/13/21 1416  Vitals shown include unvalidated device data.  Last Pain:  Vitals:   03/13/21 1050  TempSrc:   PainSc: 10-Worst pain ever         Complications: No notable events documented.

## 2021-03-13 NOTE — Progress Notes (Addendum)
Complete and pushed for over 1 hr with no change in station at 0 / +1 and caput noted. Borderline pelvis but baby 6 lbs.  Pt refusing to push any more and wants C/section  BP 140/82   Pulse 89   Temp 98.2 F (36.8 C) (Oral)   Resp 18   SpO2 100%  FHT cat I  UCs spaced out since stopping pitocin Exam again Vx +1, caput, narrow pelvic walls, could try to push again since allowed her to rest but she declines and wants C/section  PEC stable, no neural s/s, some BPs high with pain.   Proceed with C-section for CPD/ arrest of descent. Failed pushing.   Risks/complications of surgery reviewed incl infection, bleeding, damage to internal organs including bladder, bowels, ureters, blood vessels, other risks from anesthesia, VTE and delayed complications of any surgery, complications in future surgery reviewed. Also discussed neonatal complications incl difficult delivery, laceration, vacuum assistance, TTN etc. Pt understands and agrees, all concerns addressed.

## 2021-03-13 NOTE — Progress Notes (Addendum)
Back pain and pressure, wants to push. Not slept much, epidural "not helping" but legs are quite numb and allowing better exams vs before it.   BP (!) 141/83   Pulse 89   Temp 97.7 F (36.5 C) (Oral)   Resp 18   SpO2 100%   Complete and +1 per RN. Pushing, poor effort, RN coaching FHT 150s mod variability, + accels, no decels - cat I  Toco q 3 min  PEC, severe range BPs leading to early IOL, 36.3 wks. GBS(-) No further IV antiHTN overnight.  Close observation, PP magnesium IV

## 2021-03-13 NOTE — Anesthesia Preprocedure Evaluation (Addendum)
Anesthesia Evaluation  Patient identified by MRN, date of birth, ID band Patient awake    Reviewed: Allergy & Precautions, H&P , NPO status , Patient's Chart, lab work & pertinent test results  Airway Mallampati: II   Neck ROM: full    Dental   Pulmonary neg pulmonary ROS,    breath sounds clear to auscultation       Cardiovascular hypertension,  Rhythm:regular Rate:Normal     Neuro/Psych    GI/Hepatic   Endo/Other    Renal/GU      Musculoskeletal   Abdominal   Peds  Hematology   Anesthesia Other Findings   Reproductive/Obstetrics (+) Pregnancy                             Anesthesia Physical Anesthesia Plan  ASA: 2  Anesthesia Plan: Epidural   Post-op Pain Management:    Induction: Intravenous  PONV Risk Score and Plan: 2 and Treatment may vary due to age or medical condition  Airway Management Planned: Natural Airway  Additional Equipment:   Intra-op Plan:   Post-operative Plan:   Informed Consent: I have reviewed the patients History and Physical, chart, labs and discussed the procedure including the risks, benefits and alternatives for the proposed anesthesia with the patient or authorized representative who has indicated his/her understanding and acceptance.     Dental advisory given  Plan Discussed with: Anesthesiologist  Anesthesia Plan Comments: (C-section called for arrest of descent. Patient still complaining of significant back pain with labor, but minimal to no sharp abdominal pain (only pressure) with contractions. Plan to utilize epidural for c-section. Initially, patient requesting general anesthesia directly from the start. Discussed with patient at length the benefits of utilizing epidural or spinal anesthesia over general anesthesia as a first line option, while saving general anesthesia as a backup plan. Patient expressed understanding and was on board with  the plan to use the epidural and save general anesthesia as a backup plan. )       Anesthesia Quick Evaluation

## 2021-03-13 NOTE — Progress Notes (Addendum)
Heather Mcclure is a 30 y.o. G3P0020 at [redacted]w[redacted]d by IOL for PEC with severe range BPs but most improved   Subjective: S/p epidural.    Objective: BP 138/76   Pulse 72   Temp 98.1 F (36.7 C) (Axillary)   Resp 16   SpO2 100%   FHR: 130 bpm, variability: moderate,  accelerations:  Present,  decelerations:  Absent UC:   regular, every 3-5 minutes SVE:   Dilation: 2 Effacement (%): 100 Station: -2 Exam by:: Janaria Mccammon, MD AROM, small clear fluid. Head well applied to cx and is engaged  Assessment / Plan: Induction of labor due to preeclampsia- severe range BPs, s/p one IV Labetalol.  AROM, pitocin as tolerated and needed. Early labor Right exaggerated sims position  Preeclampsia:  no signs or symptoms of toxicity, labs stable, and no neurologic symptoms, deferring Mag, start postpartum  Fetal Wellbeing:  Category I Pain Control:  Epidural I/D:  GBS(-)  Anticipated MOD:  NSVD planned   Robley Fries 03/13/2021, 4:43 AM

## 2021-03-13 NOTE — Progress Notes (Signed)
D/w pt cervical balloon placement. Reports being in pain and wants more pain med.  She cannot keep her eyes open and cannot sit or move in bed.  RN reports very difficult pelvic exams, clenches and moves up in bed.  Recc early epidural instead and attempt cervical balloon after that.

## 2021-03-13 NOTE — Lactation Note (Addendum)
This note was copied from a baby's chart. Lactation Consultation Note  Patient Name: Heather Mcclure [id number] Date: 03/13/2021 Reason for consult: 1st time breastfeeding;Late-preterm 34-36.6wks;Initial assessment;Other (Comment) (hypoglycemia (low blood sugar)) Age:31 hours Per mom, infant briefly latched in earlier. LC change large void diaper. Mom's current feeding choice is breast and bottle feeding, infant been supplemented once with 10 mls of formula with slow flow bottle nipple.  Mom attempted to latch infant on her right breast using the cross cradle hold, after few suckles infant only held breast in mouth 2 minutes or less. Mom afterwards did hand expression, MGM gave infant 10 mls of colostrum by slow flow bottle nipple. Mom was using the DEBP while MGM feed infant mom's  EBM. LC discussed infant's input and output with parents. Mom made aware of O/P services, breastfeeding support groups, community resources, and our phone # for post-discharge questions.   Mom's plan: 1-Mom will do breast stimulation prior to latching infant at the breast, breastfeed infant according to feeding cues, 8 to 12+ times within 24 hours, skin to skin. 2- Mom will ask RN or LC for latch assistance if needed.   3- Mom will follow LPTI feeding policy ( green sheet) explained to parents. 4- Afterwards mom will give infant Day 2 after latching at breast, 10 mls of EBM and or formula per feeding. 5- Mom will continue to use DEBP every 3 hours for 15 minutes on initial setting, giving infant any EBM first before formula.  Maternal Data Has patient been taught Hand Expression?: Yes Does the patient have breastfeeding experience prior to this delivery?: No  Feeding Mother's Current Feeding Choice: Breast Milk and Formula Nipple Type: Slow - flow  LATCH Score Latch: Repeated attempts needed to sustain latch, nipple held in mouth throughout feeding, stimulation needed to elicit sucking reflex.  Audible  Swallowing: None  Type of Nipple: Everted at rest and after stimulation (short shafted nipples, that responds well to stimulation.)  Comfort (Breast/Nipple): Soft / non-tender  Hold (Positioning): Assistance needed to correctly position infant at breast and maintain latch.  LATCH Score: 6   Lactation Tools Discussed/Used Tools: Pump Breast pump type: Double-Electric Breast Pump Pump Education: Setup, frequency, and cleaning;Milk Storage Reason for Pumping: LPTI, not latching well yet at breast Pumping frequency: Mom will pump every 3 hours for 15 minutes on inital setting.  Interventions Interventions: Breast feeding basics reviewed;Assisted with latch;Breast compression;Adjust position;Skin to skin;Support pillows;Breast massage;Position options;Hand express;Expressed milk;Education;DEBP  Discharge Pump: Personal (Mom has Spectra 2 DEBP) WIC Program: No  Consult Status Consult Status: Follow-up Date: 03/14/21 Follow-up type: In-patient    Heather Mcclure 03/13/2021, 6:36 PM

## 2021-03-13 NOTE — Anesthesia Procedure Notes (Signed)
Epidural Patient location during procedure: OB Start time: 03/13/2021 3:10 AM End time: 03/13/2021 3:22 AM  Staffing Anesthesiologist: Achille Rich, MD Performed: anesthesiologist   Preanesthetic Checklist Completed: patient identified, IV checked, site marked, risks and benefits discussed, monitors and equipment checked, pre-op evaluation and timeout performed  Epidural Patient position: sitting Prep: DuraPrep Patient monitoring: heart rate, cardiac monitor, continuous pulse ox and blood pressure Approach: midline Location: L2-L3 Injection technique: LOR saline  Needle:  Needle type: Tuohy  Needle gauge: 17 G Needle length: 9 cm Needle insertion depth: 5 cm Catheter type: closed end flexible Catheter size: 19 Gauge Catheter at skin depth: 11 cm Test dose: negative and Other  Assessment Events: blood not aspirated, injection not painful, no injection resistance and negative IV test  Additional Notes Informed consent obtained prior to proceeding including risk of failure, 1% risk of PDPH, risk of minor discomfort and bruising.  Discussed rare but serious complications including epidural abscess, permanent nerve injury, epidural hematoma.  Discussed alternatives to epidural analgesia and patient desires to proceed.  Timeout performed pre-procedure verifying patient name, procedure, and platelet count.  Patient tolerated procedure well. Reason for block:procedure for pain

## 2021-03-13 NOTE — Progress Notes (Signed)
Heather Mcclure is a 30 y.o. G3P0020 at [redacted]w[redacted]d by ultrasound admitted for induction of labor due to Pre-eclamptic toxemia of pregnancy..  Subjective: Pain with UCs , s/p one dose of Stadol. S/p 2 Cytotecs and now UCs spacing out.   Objective: BP (!) 148/91   Pulse 76   Temp 98.4 F (36.9 C) (Oral)   Resp 16   SpO2 100%   FHT:  FHR: 130 bpm, variability: moderate,  accelerations:  Present,  decelerations:  Absent UC:   regular, every 3-5 minutes SVE:   Dilation: 1.5 Effacement (%): 80 Station: -2 Exam by:: J. Mbugua, RN  Labs: Lab Results  Component Value Date   WBC 8.8 03/12/2021   HGB 11.0 (L) 03/12/2021   HCT 32.2 (L) 03/12/2021   MCV 89.7 03/12/2021   PLT 247 03/12/2021    Assessment / Plan: Induction of labor due to preeclampsia- severe range BPs, s/p one IV Labetalol.  S/p Cytotec x 2 doses and now UCs spacing out, start pitocin as tolerated   Labor: Progressing normally Preeclampsia:  no signs or symptoms of toxicity, labs stable, and no neurologic symptoms, deferring Mag, start postpartum  Fetal Wellbeing:  Category I Pain Control:  IV pain meds I/D:  n/a Anticipated MOD:  NSVD planned   Heather Mcclure 03/13/2021, 2:36 AM

## 2021-03-14 ENCOUNTER — Other Ambulatory Visit: Payer: Self-pay

## 2021-03-14 DIAGNOSIS — O9902 Anemia complicating childbirth: Secondary | ICD-10-CM | POA: Diagnosis present

## 2021-03-14 LAB — CBC
HCT: 25.5 % — ABNORMAL LOW (ref 36.0–46.0)
Hemoglobin: 8.8 g/dL — ABNORMAL LOW (ref 12.0–15.0)
MCH: 31 pg (ref 26.0–34.0)
MCHC: 34.5 g/dL (ref 30.0–36.0)
MCV: 89.8 fL (ref 80.0–100.0)
Platelets: 208 10*3/uL (ref 150–400)
RBC: 2.84 MIL/uL — ABNORMAL LOW (ref 3.87–5.11)
RDW: 14.3 % (ref 11.5–15.5)
WBC: 15.8 10*3/uL — ABNORMAL HIGH (ref 4.0–10.5)
nRBC: 0 % (ref 0.0–0.2)

## 2021-03-14 LAB — COMPREHENSIVE METABOLIC PANEL
ALT: 8 U/L (ref 0–44)
AST: 21 U/L (ref 15–41)
Albumin: 1.9 g/dL — ABNORMAL LOW (ref 3.5–5.0)
Alkaline Phosphatase: 107 U/L (ref 38–126)
Anion gap: 6 (ref 5–15)
BUN: 9 mg/dL (ref 6–20)
CO2: 23 mmol/L (ref 22–32)
Calcium: 7.3 mg/dL — ABNORMAL LOW (ref 8.9–10.3)
Chloride: 100 mmol/L (ref 98–111)
Creatinine, Ser: 0.65 mg/dL (ref 0.44–1.00)
GFR, Estimated: >60 mL/min (ref >60)
Glucose, Bld: 110 mg/dL — ABNORMAL HIGH (ref 70–99)
Potassium: 4.1 mmol/L (ref 3.5–5.1)
Sodium: 129 mmol/L — ABNORMAL LOW (ref 135–145)
Total Bilirubin: 0.5 mg/dL (ref 0.3–1.2)
Total Protein: 4.8 g/dL — ABNORMAL LOW (ref 6.5–8.1)

## 2021-03-14 LAB — MAGNESIUM: Magnesium: 6.8 mg/dL (ref 1.7–2.4)

## 2021-03-14 NOTE — Progress Notes (Addendum)
   Subjective: POD# 1 Live born female  Birth Weight: 5 lb 2.7 oz (2345 g) APGAR: 8, 9  Newborn Delivery   Birth date/time: [birth year]/2021 13:19:00 Delivery type: C-Section, Low Transverse C-section categorization: Primary     Baby name: Enid Derry Delivering provider:  Mody  circumcision declined Feeding: breast and bottle  Pain control at delivery:  epidural  Reports feeling tired, little sleep last night.  Patient reports tolerating PO.   Breast symptoms:+ colostrum Pain controlled with PO meds. Denies HA/SOB/C/P/N/V/dizziness. Flatus present. She reports vaginal bleeding as normal, without clots.  She is ambulating, urinating without difficulty.     Objective:   VS:    Vitals:   03/14/21 0325 03/14/21 0610 03/14/21 0811 03/14/21 0812  BP: 109/67   110/75  Pulse: 86   94  Resp: 17   16  Temp: 97.6 F (36.4 C)   98.7 F (37.1 C)  TempSrc: Oral  Oral Oral  SpO2: 98%   96%  Weight:  69.8 kg       Intake/Output Summary (Last 24 hours) at 03/14/2021 6195 Last data filed at 03/14/2021 0500 Gross per 24 hour  Intake 3596.11 ml  Output 3783 ml  Net -186.89 ml        Recent Labs    03/13/21 1456 03/14/21 0431  WBC 17.1* 15.8*  HGB 10.5* 8.8*  HCT 32.1* 25.5*  PLT 252 208     Blood type: --/--/A POS (08/25 1317)  Rubella: Immune (02/23 0000)  Vaccines: TDaP          UTD                    COVID-19 UTD   Physical Exam:  General: alert, cooperative, and no distress CV: Regular rate and rhythm Resp: clear Abdomen: soft, nontender, normal bowel sounds, + timpany Incision: intact and honeycomb dressing with serous and large amount drainage present, pressure dressing removed Uterine Fundus: firm, below umbilicus, nontender Lochia: minimal Ext: no edema, redness or tenderness in the calves or thighs  Assessment/Plan: 30 y.o.   POD# 1. [id number]                  Principal Problem:   Postpartum care following cesarean delivery 8/26 Active Problems:    Preeclampsia  - stable, no neural sx, good diuresis  - Mag Sulfate x 24 hrs, DC at 1400  - normotensive since delivery, currently on labetalol 100 mg TID and Procardia 30XL daily  - continue to monitor closely   Encounter for induction of labor   Cesarean delivery delivered - 1LTCS / AOD   Hypertension in pregnancy, preeclampsia, severe, delivered   Maternal anemia, with delivery - IDA w/ ABL  - asymptomatic  - started on oral Fe and Mag Ox  Doing well, stable.     Change honeycomb dressing after patient showers         Advance diet as tolerated Encourage rest when baby rests Breastfeeding support Encourage to ambulate later this afternoon Routine post-op care  Neta Mends, CNM, MSN 03/14/2021, 8:32 AM

## 2021-03-14 NOTE — Progress Notes (Signed)
Discussed with patient the plan of care to remove foley cath. And ambulate in hallway.  Patient states " Im very weak and tired and want to wait to remove foley and walk."  Discussed with patient that I wjould return to remove foley in 1-2 hours and ambulate.

## 2021-03-15 MED ORDER — SODIUM CHLORIDE 0.9% FLUSH: 3.0000 mL | Freq: Two times a day (BID) | INTRAVENOUS | Status: DC

## 2021-03-15 NOTE — Lactation Note (Signed)
This note was copied from a baby's chart. Lactation Consultation Note  Patient Name: Heather Mcclure [id number] Date: 03/15/2021 Reason for consult: Follow-up assessment;Infant < 6lbs;Late-preterm 34-36.6wks;Hyperbilirubinemia;1st time breastfeeding;Primapara Age:31 hours  LC in to visit with P1 Mom of LPTI at 5% weight loss with bilirubin level high intermediate.  Mom asking for help with breastfeeding when [facility] entered room. Baby sucking on pacifier vigorously.    Positioned baby STS on Mom's chest after doing breast massage and hand expression for drop of colostrum.  Baby opening his mouth wide and sucking vigorously on and off the breast.  After a couple minutes baby fell asleep.    Assisted Mom to pace bottle feed baby 22 cal formula.  Baby took 8 ml while LC in room.    DEBP set up at bedside.  Mom has pumped one time.  Talked to Mom about importance of consistent pumping to support her milk supply.   LC set up a washing and a drying bin and showed Mom how to disassemble pump parts, wash, rinse and air dry.  Pump parts had been dragging on floor.    Plan- 1- Keep baby STS as much as possible 2- Offer breast with cues and awaken baby for feeding if he has been sleeping 3 hrs. Limit time at breast to avoid overtiring baby (15-31min) 3- supplement each time with 20-30 ml 22 cal formula by bottle 4- Pump both breasts 15 mins on initiation setting., adding breast massage and hand expression to stimulate milk volume.  Mom has a Spectra DEBP at home.  Feeding Nipple Type: Nfant Slow Flow (purple)  LATCH Score Latch: Repeated attempts needed to sustain latch, nipple held in mouth throughout feeding, stimulation needed to elicit sucking reflex.  Audible Swallowing: None  Type of Nipple: Everted at rest and after stimulation  Comfort (Breast/Nipple): Soft / non-tender  Hold (Positioning): Assistance needed to correctly position infant at breast and maintain latch.  LATCH Score:  6   Lactation Tools Discussed/Used Tools: Pump;Flanges;Bottle Flange Size: 24 Breast pump type: Double-Electric Breast Pump Pump Education: Setup, frequency, and cleaning;Milk Storage Reason for Pumping: support milk supply/<6 lbs LPTI Pumping frequency: X 1 Pumped volume: 0 mL  Interventions Interventions: Breast feeding basics reviewed;Assisted with latch;Skin to skin;Breast massage;Hand express;Support pillows;Position options;DEBP;Education;Pace feeding  Consult Status Consult Status: Follow-up Date: 03/16/21 Follow-up type: In-patient    Judee Clara 03/15/2021, 12:14 PM

## 2021-03-15 NOTE — Progress Notes (Signed)
   Subjective: POD# 2 Live born female  Birth Weight: 5 lb 2.7 oz (2345 g) APGAR: 8, 9  Newborn Delivery   Birth date/time: [birth year]/2021 13:19:00 Delivery type: C-Section, Low Transverse C-section categorization: Primary     Baby name: Enid Derry Delivering provider:  Mody   circumcision declined Feeding: breast and bottle   Pain control at delivery:  epidural  Reports feeling sore but well, has been up in room, pain with moving but able to rest comfortably.  Denies HA/SOB/C/P/N/V/dizziness. Flatus present. She reports vaginal bleeding as normal, without clots.  She is ambulating, urinating without difficulty.     Objective:   VS:    Vitals:   03/14/21 2115 03/15/21 0009 03/15/21 0341 03/15/21 0817  BP: 114/70 131/77 114/75 110/73  Pulse: 90 (!) 102 90 83  Resp:   16 16  Temp:   97.6 F (36.4 C) 98 F (36.7 C)  TempSrc:   Oral Oral  SpO2:  99% 99% 96%  Weight:   65.3 kg   Height:         Intake/Output Summary (Last 24 hours) at 03/15/2021 1016 Last data filed at 03/15/2021 0847 Gross per 24 hour  Intake 868.51 ml  Output 5250 ml  Net -4381.49 ml        Recent Labs    03/13/21 1456 03/14/21 0431  WBC 17.1* 15.8*  HGB 10.5* 8.8*  HCT 32.1* 25.5*  PLT 252 208     Blood type: --/--/A POS (08/25 1317)  Rubella: Immune (02/23 0000)  Vaccines: TDaP          UTD                    COVID-19 UTD   Physical Exam:  General: alert, cooperative, and no distress Abdomen: soft, nontender, normal bowel sounds Incision: clean, dry, and intact Uterine Fundus: firm, below umbilicus, nontender Lochia: minimal Ext: edema +1 pedal, no cords or tenderness  Assessment/Plan: 30 y.o.   POD# 2. [id number]                  Principal Problem:   Postpartum care following cesarean delivery 8/26 Active Problems:    Preeclampsia             - stable, no neural sx, good diuresis             - s/p Mag sulfate x 24 hrs             - normotensive since delivery, currently on labetalol  100 mg TID and Procardia 30XL daily, labetalol was held yesterday for BP < 100/70. Will monitor today and consider decreasing labetalol to BID if BP low again    Maternal anemia, with delivery - IDA w/ ABL             - asymptomatic             - started on oral Fe and Mag Ox    Encounter for induction of labor   Cesarean delivery delivered - 1LTCS / AOD  Doing well, stable.               Advance diet as tolerated Encourage rest when baby rests Breastfeeding support Encourage to ambulate Routine post-op care  Neta Mends, CNM, MSN 03/15/2021, 10:16 AM

## 2021-03-16 ENCOUNTER — Inpatient Hospital Stay (HOSPITAL_COMMUNITY): Payer: 59

## 2021-03-16 ENCOUNTER — Encounter (HOSPITAL_COMMUNITY): Payer: Self-pay

## 2021-03-16 ENCOUNTER — Inpatient Hospital Stay (HOSPITAL_COMMUNITY): Admission: AD | Admit: 2021-03-16 | Payer: 59 | Source: Home / Self Care | Admitting: Obstetrics & Gynecology

## 2021-03-16 MED ORDER — LABETALOL HCL 100 MG PO TABS: 100.0000 mg | ORAL_TABLET | Freq: Two times a day (BID) | ORAL | Status: DC

## 2021-03-16 MED ORDER — COCONUT OIL OIL: 1.0000 "application " | TOPICAL_OIL | 0 refills | Status: AC | PRN

## 2021-03-16 MED ORDER — NIFEDIPINE ER 30 MG PO TB24: 30.0000 mg | ORAL_TABLET | Freq: Every day | ORAL | 0 refills | Status: AC

## 2021-03-16 MED ORDER — IBUPROFEN 600 MG PO TABS: 600.0000 mg | ORAL_TABLET | Freq: Four times a day (QID) | ORAL | 0 refills | Status: AC

## 2021-03-16 MED ORDER — LABETALOL HCL 100 MG PO TABS: 100.0000 mg | ORAL_TABLET | Freq: Two times a day (BID) | ORAL | 1 refills | Status: AC

## 2021-03-16 NOTE — Discharge Summary (Signed)
Postpartum Discharge Summary  Date of Service updated 03/16/21     Patient Name: Heather Mcclure DOB: 08/02/1990 MRN: [id number]  Date of admission: 03/12/2021 Delivery date:03/13/2021  Delivering provider:   Date of discharge: 03/16/2021  Admitting diagnosis: Encounter for induction of labor [Z34.90] Hypertension in pregnancy, preeclampsia, severe, delivered [O14.14] Intrauterine pregnancy: [redacted]w[redacted]d    Secondary diagnosis:  Principal Problem:   Postpartum care following cesarean delivery 8/26 Active Problems:   Preeclampsia   Encounter for induction of labor   Cesarean delivery delivered - 1LTCS / AOD   Hypertension in pregnancy, preeclampsia, severe, delivered   Maternal anemia, with delivery - IDA w/ ABL  Additional problems: hx. Of anxiety, EPDS score 11    Discharge diagnosis: Term Pregnancy Delivered, Preeclampsia (severe), and Anemia                                              Post partum procedures: 24 hours Magnesium sulfate post delivery Augmentation: AROM, Pitocin, and Cytotec Complications: None  Hospital course: Induction of Labor With Cesarean Section   30y.o. yo [id number] admitted to the hospital 03/12/2021 for induction of labor for severe pre-eclampsia. BrBerenise Huntons a 2160o [id number], at 36.2 wks with Preeclampsia diagnosis since 1 wk and now new onset severe range BPs, admitted for IOL. S/p BMTZ last wk Patient had a labor course significant pushing without any change in station from +1 for over an hour and declined further pushing and requested C-section. Exam consistent with CPD/ inadequate pelvic, OP position and caput at +1.. The patient went for cesarean section due to Arrest of Descent. Delivery details are as follows: Membrane Rupture Time/Date: 3:41 AM ,03/13/2021   Delivery Method:C-Section, Low Transverse  Details of operation can be found in separate operative Note.  Patient received 24 hours postpartum magnesium sulfate. She was  continued on Labetalol 10066m8hrs and started on Procardia 88m62m daily. By POD #3, BPs were well controlled and some were lower, so we reduced her Labetalol 100mg32m and continued on Procardia 88mg 62maily. She is ambulating, tolerating a regular diet, passing flatus, and urinating well.  Patient is discharged home in stable condition on 03/16/21. She is going to room-in with her baby who was started on phototherapy. Her husband will bring in their home BP cuff and home medications. We reviewed discharge instructions and [facility] discharge book was given. She will plan to f/u in office on Friday for repeat BP check/EPDS. Her EPDS was 11, and she was seen by CSW prior to d/c.   Newborn Data: Birth date:03/13/2021  Birth time:1:19 PM  Gender:Female  Living status:Living  Apgars:8 ,9  [phone]       "Ethan"                       Magnesium Sulfate received: Yes: Seizure prophylaxis BMZ received: Yes Rhophylac:N/A MMR:N/A T-DaP:Given prenatally Flu: N/A Transfusion:No  Physical exam  Vitals:   03/16/21 0355 03/16/21 0356 03/16/21 0606 03/16/21 0754  BP: (!) 107/57  122/86 115/72  Pulse: 77  87 76  Resp: 18   18  Temp: 98 F (36.7 C)  98 F (36.7 C) 98.2 F (36.8 C)  TempSrc: Oral  Oral Oral  SpO2:  98%  99%  Weight:  Height:       General: alert, cooperative, and no distress Heart: RRR Lungs: clear, equal bilaterally  Lochia: appropriate Uterine Fundus: firm, below umbilicus  Incision: Healing well with no significant drainage, No significant erythema, Dressing is clean, dry, and intact DVT Evaluation: No evidence of DVT seen on physical exam. No significant calf/ankle edema. Calf/Ankle edema is present LE evaluation: +1 DTRs and no clonus bilaterally Labs: Lab Results  Component Value Date   WBC 15.8 (H) 03/14/2021   HGB 8.8 (L) 03/14/2021   HCT 25.5 (L) 03/14/2021   MCV 89.8 03/14/2021   PLT 208 03/14/2021   CMP Latest Ref Rng & Units 03/14/2021  Glucose 70  - 99 mg/dL 110(H)  BUN 6 - 20 mg/dL 9  Creatinine 0.44 - 1.00 mg/dL 0.65  Sodium 135 - 145 mmol/L 129(L)  Potassium 3.5 - 5.1 mmol/L 4.1  Chloride 98 - 111 mmol/L 100  CO2 22 - 32 mmol/L 23  Calcium 8.9 - 10.3 mg/dL 7.3(L)  Total Protein 6.5 - 8.1 g/dL 4.8(L)  Total Bilirubin 0.3 - 1.2 mg/dL 0.5  Alkaline Phos 38 - 126 U/L 107  AST 15 - 41 U/L 21  ALT 0 - 44 U/L 8   Edinburgh Score: Edinburgh Postnatal Depression Scale Screening Tool 03/15/2021  I have been able to laugh and see the funny side of things. 0  I have looked forward with enjoyment to things. 0  I have blamed myself unnecessarily when things went wrong. 2  I have been anxious or worried for no good reason. 3  I have felt scared or panicky for no good reason. 3  Things have been getting on top of me. 2  I have been so unhappy that I have had difficulty sleeping. 0  I have felt sad or miserable. 1  I have been so unhappy that I have been crying. 0  The thought of harming myself has occurred to me. 0  Edinburgh Postnatal Depression Scale Total 11      After visit meds:  Allergies as of 03/16/2021   No Known Allergies      Medication List     STOP taking these medications    calcium carbonate 1250 (500 Ca) MG chewable tablet Commonly known as: OS-CAL   calcium carbonate 500 MG chewable tablet Commonly known as: TUMS - dosed in mg elemental calcium   promethazine 12.5 MG tablet Commonly known as: PHENERGAN       TAKE these medications    acetaminophen 500 MG tablet Commonly known as: TYLENOL Take 1 tablet (500 mg total) by mouth every 6 (six) hours as needed.   coconut oil Oil Apply 1 application topically as needed.   docusate sodium 100 MG capsule Commonly known as: COLACE Take 1 capsule (100 mg total) by mouth 2 (two) times daily.   famotidine 20 MG tablet Commonly known as: PEPCID Take 20 mg by mouth 2 (two) times daily.   ibuprofen 600 MG tablet Commonly known as: ADVIL Take 1 tablet  (600 mg total) by mouth every 6 (six) hours.   iron polysaccharides 150 MG capsule Commonly known as: NIFEREX Take 1 capsule (150 mg total) by mouth daily.   labetalol 100 MG tablet Commonly known as: NORMODYNE Take 1 tablet (100 mg total) by mouth 2 (two) times daily. What changed: when to take this   magnesium 30 MG tablet Take 30 mg by mouth daily.   NIFEdipine 30 MG 24 hr tablet Commonly known as: ADALAT CC  Take 1 tablet (30 mg total) by mouth daily. Start taking on: March 17, 2021   Prenatal Vitamin 27-0.8 MG Tabs Take 1 tablet by mouth daily.   vitamin B-12 500 MCG tablet Commonly known as: CYANOCOBALAMIN Take 500 mcg by mouth daily.         Discharge home in stable condition Infant Feeding: Bottle and Breast Infant Disposition:rooming in Discharge instruction: per After Visit Summary and Postpartum booklet. Activity: Advance as tolerated. Pelvic rest for 6 weeks.  Diet: low salt diet Anticipated Birth Control: Unsure Postpartum Appointment: Friday for BP check  Additional Postpartum F/U: Postpartum Depression checkup Future Appointments:No future appointments. Follow up Visit:  Follow-up Information     Azucena Fallen, MD. Schedule an appointment as soon as possible for a visit on 03/20/2021.   Specialty: Obstetrics and Gynecology Why: PP BP check, EPDS Remove honeycomb dressing on 8/31 Contact information: [address] [place] [address] [phone]                     03/16/2021 Darliss Cheney, CNM

## 2021-03-16 NOTE — Progress Notes (Signed)
Patient discharged to home, but will remain in hospital with baby who is not yet being discharged.  Discharge instructions reviewed with patient, husband together.  Husband plans to pick up prescriptions at pharmacy this afternoon.  No equipment for home ordered at discharge.

## 2021-03-16 NOTE — Lactation Note (Addendum)
This note was copied from a baby's chart. Lactation Consultation Note  Patient Name: Heather Mcclure XENMM'H Date: 03/16/2021 Reason for consult: Follow-up assessment;Mother's request;Difficult latch;Primapara;1st time breastfeeding;Late-preterm 34-36.6wks;Hyperbilirubinemia Age:31 hours  Mom stated infant sleeping during feeding. Room temp at 75 f we reduced to 72 and loosen his blankets so he can feed more.  LPTI guidelines reviewed to reduce calorie loss including keeping total feeding under 30 min.    Plan 1. To feed based on cues 8-12x in 24 hr. Mom to offer breast first then offer EBM first followed by formula with pace bottle feeding and nfant purple nipple.  2. Mom pumping with DEBP q 3hrs for 15 min. Mom aware she can switch to maintenance phase once she reaches 20 ml  or more consecutively with pumping session. Last pumping session mom pumped 40 ml.  All questions answered at the end of the visit.  3. Mom to encourage increase in volume as tolerated, infant not latching at the breast during this visit.  Maternal Data    Feeding Mother's Current Feeding Choice: Breast Milk and Formula Nipple Type: Nfant Slow Flow (purple)  LATCH Score                    Lactation Tools Discussed/Used Tools: Pump Breast pump type: Double-Electric Breast Pump Pump Education: Setup, frequency, and cleaning;Milk Storage Reason for Pumping: increase stimulation Pumping frequency: every 3 hrs for 15 in  Interventions Interventions: Breast feeding basics reviewed;Breast compression;Assisted with latch;Skin to skin;Support pillows;DEBP;Breast massage;Hand express;Expressed milk;Education;Pace feeding  Discharge    Consult Status Consult Status: Follow-up Date: 03/17/21 Follow-up type: In-patient    Heather Mcclure  Heather Mcclure 03/16/2021, 12:19 PM

## 2021-03-16 NOTE — Progress Notes (Signed)
CSW received consult due to score 11 on Edinburgh Depression Screen.    CSW met with MOB at bedside to discuss edinburgh score, MOB's mother and husband/FOB present. CSW introduced self and explained role. MOB granted CSW verbal permission to speak in front of her mother and husband. MOB was welcoming, pleasant, and remained engaged during assessment. CSW and MOB discussed edinburgh score 11. MOB shared that she spoke with her provider about her score being a little high earlier. MOB attributed her edinburgh score to infant being preterm and experiencing preeclampsia. CSW acknowledged and validated MOB's feelings/experience. MOB denied any mental health history. CSW inquired about how MOB was feeling emotionally after giving birth, MOB reported that she was feeling okay. CSW inquired about MOB's support system, MOB reported that her mom and husband are supports. MOB presented calm and did not demonstrate any acute mental health signs/symptoms. CSW assessed for safety, MOB denied SI and HI. CSW did not assess for domestic violence as MOB was not alone.   CSW provided education regarding Baby Blues vs PMADs and provided MOB with resources for mental health follow up.  CSW encouraged MOB to evaluate her mental health throughout the postpartum period with the use of the New Mom Checklist developed by Postpartum [facility] as well as the Edinburgh Postnatal Depression Scale and notify a medical professional if symptoms arise.     MOB reported that they have all items needed to care for infant including a car seat and crib.  CSW provided review of Sudden Infant Death Syndrome (SIDS) precautions.    CSW identifies no further need for intervention and no barriers to discharge at this time.  Heather Weisel, LCSW Clinical Social Worker [facility] Cell#: [phone] 

## 2021-03-17 ENCOUNTER — Ambulatory Visit: Payer: Self-pay

## 2021-03-17 LAB — SURGICAL PATHOLOGY

## 2021-03-17 NOTE — Lactation Note (Signed)
This note was copied from a baby's chart. Lactation Consultation Note  Patient Name: Heather Mcclure Date: 03/17/2021 Reason for consult: Follow-up assessment;1st time breastfeeding;Early term 37-38.6wks;Hyperbilirubinemia Age:31 days  P1, Baby sleeping with phototherapy.   Discussed plan of breastfeeding and offering supplementation after. Suggest parents warm bottle of breastmilk out of refrigerator for baby so he is more likely to take it. Feed on demand with cues.  Goal 8-12+ times per day after first 24 hrs.  Place baby STS if not cueing.  Reviewed engorgement care and monitoring voids/stools.  Feeding Mother's Current Feeding Choice: Breast Milk and Formula  Interventions Interventions: Breast feeding basics reviewed;DEBP;Education  Discharge Discharge Education: Engorgement and breast care;Warning signs for feeding baby Pump: DEBP;Personal  Consult Status Consult Status: Complete Date: 03/17/21    Dahlia Byes [facility] 03/17/2021, 8:58 AM

## 2021-03-25 ENCOUNTER — Telehealth (HOSPITAL_COMMUNITY): Payer: Self-pay | Admitting: *Deleted

## 2021-03-25 NOTE — Telephone Encounter (Signed)
Hospital Discharge Follow-Up Call:  Patient reports that she is doing well and has no concerns about her healing.  EPDS today was 1 and patient feels this accurately reflects that she is doing well emotionally.  Patient says that baby is well and that he sleeps in a bassinet.  Reviewed ABCs of Safe Sleep.

## 2021-08-08 IMAGING — US US OB < 14 WEEKS - US OB TV
1 series · 13 of 28 positions shown · non-contrast
Comparison: None relevant, prior Ob ultrasound 07/31/2019.

CLINICAL DATA: 29-year-old female with vaginal bleeding in the 1st
trimester of pregnancy. Quantitative beta HCG 15.

EXAM:
OBSTETRIC <14 WK US AND TRANSVAGINAL OB US
TECHNIQUE: Both transabdominal and transvaginal ultrasound examinations were
performed for complete evaluation of the gestation as well as the
maternal uterus, adnexal regions, and pelvic cul-de-sac.
Transvaginal technique was performed to assess early pregnancy.

[Series 1: us ob less than 14 weeks with ob transvaginal · 13 of 91 slices shown]
[im 4/91]
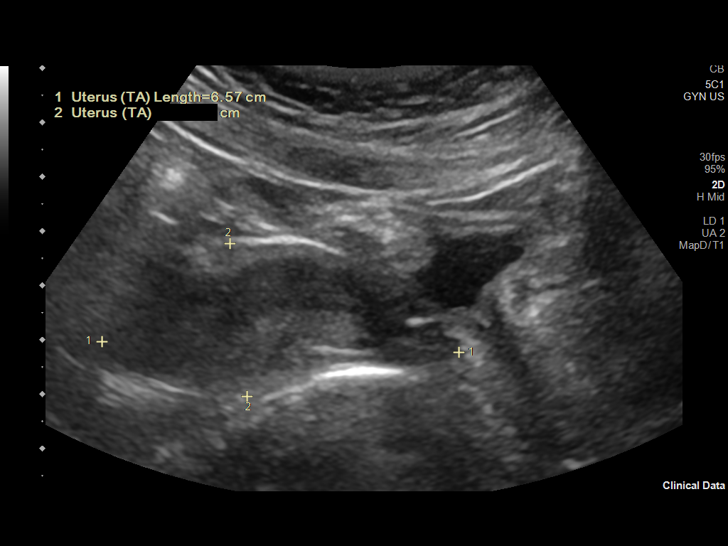
[im 11/91]
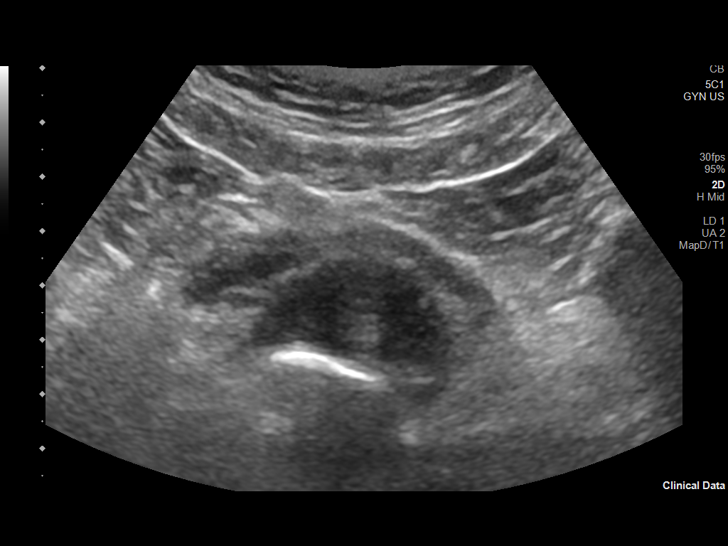
[im 17/91]
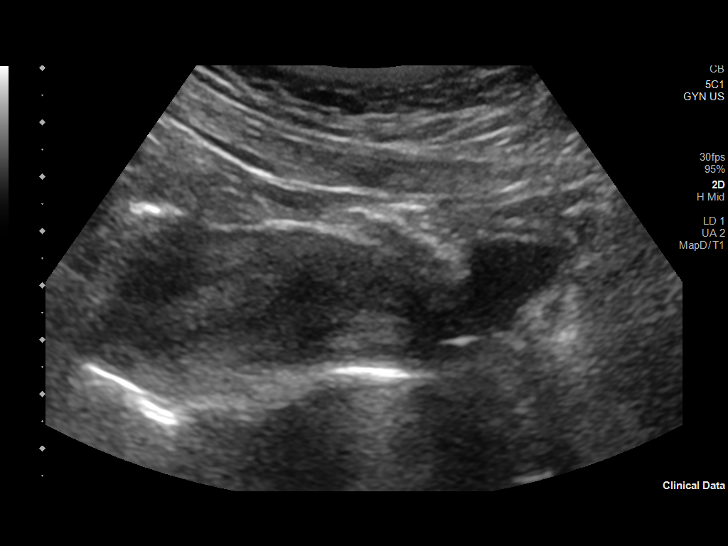
[im 24/91]
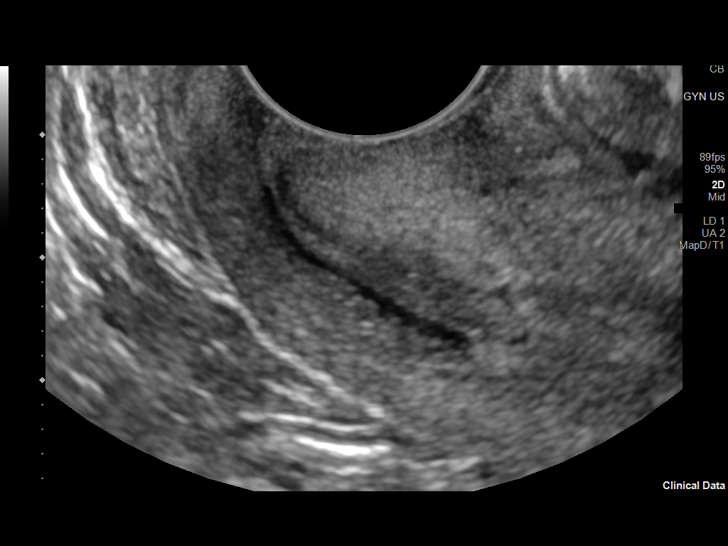
[im 31/91]
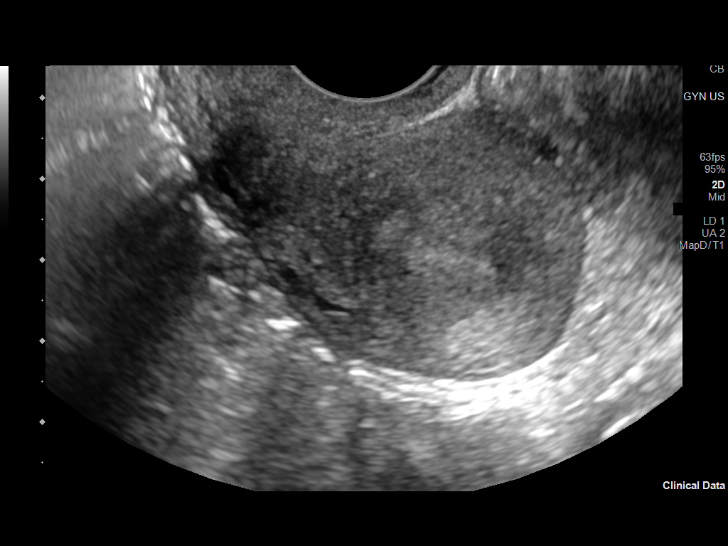
[im 37/91]
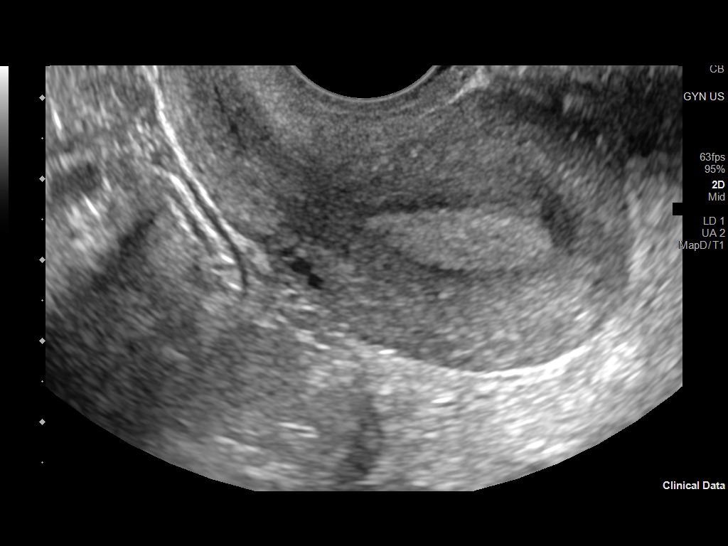
[im 47/91]
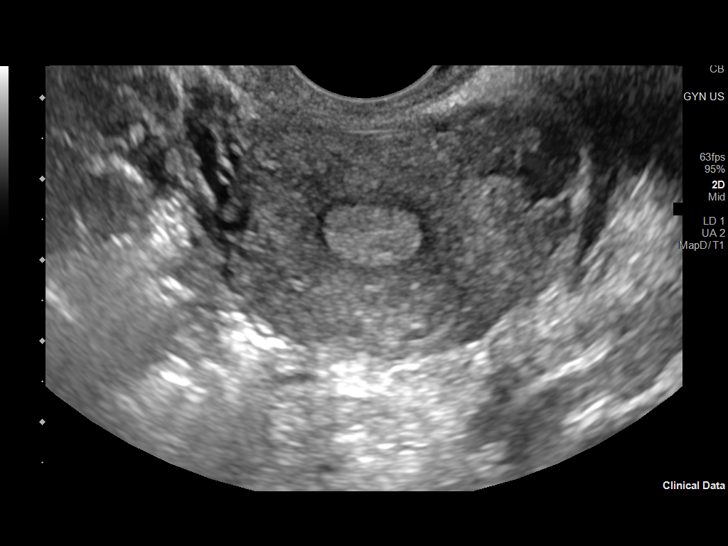
[im 54/91]
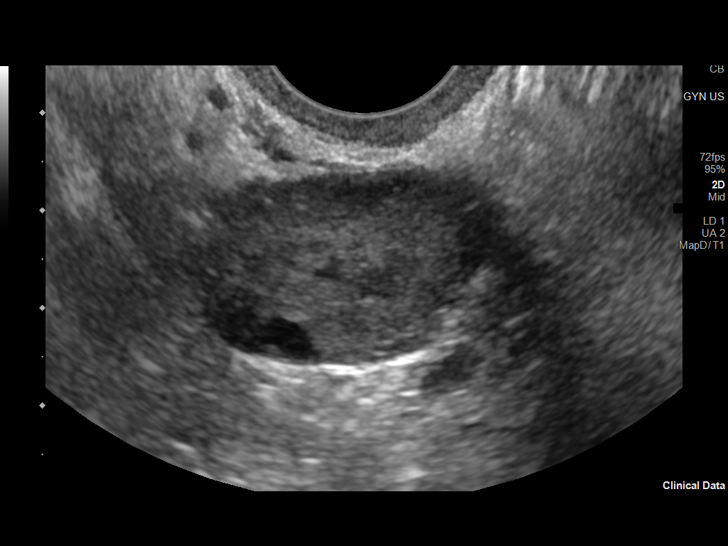
[im 61/91]
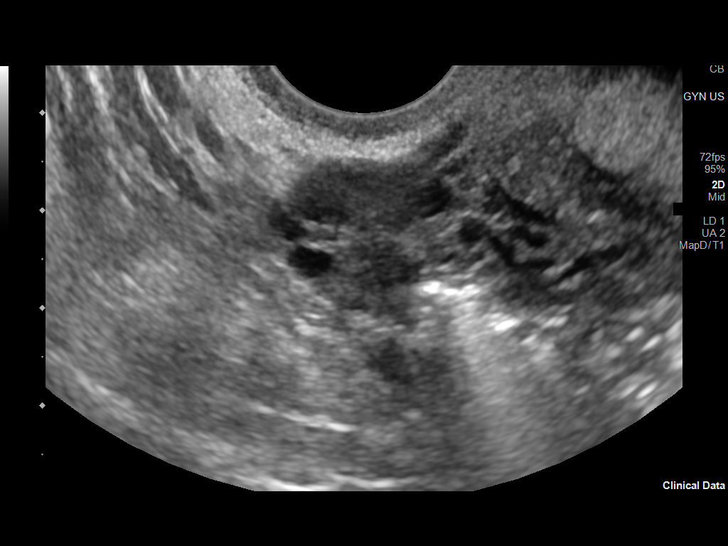
[im 67/91]
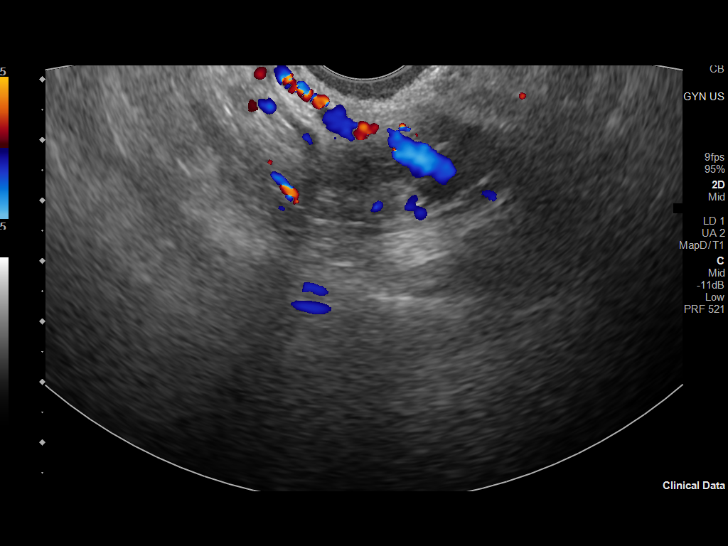
[im 74/91]
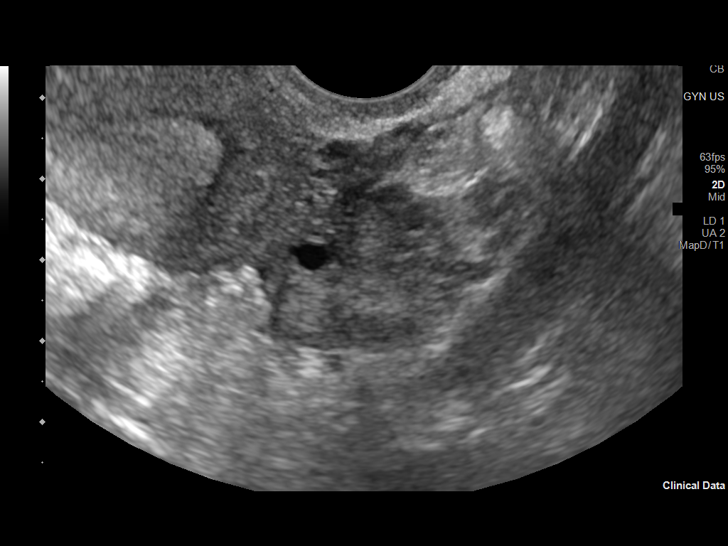
[im 81/91]
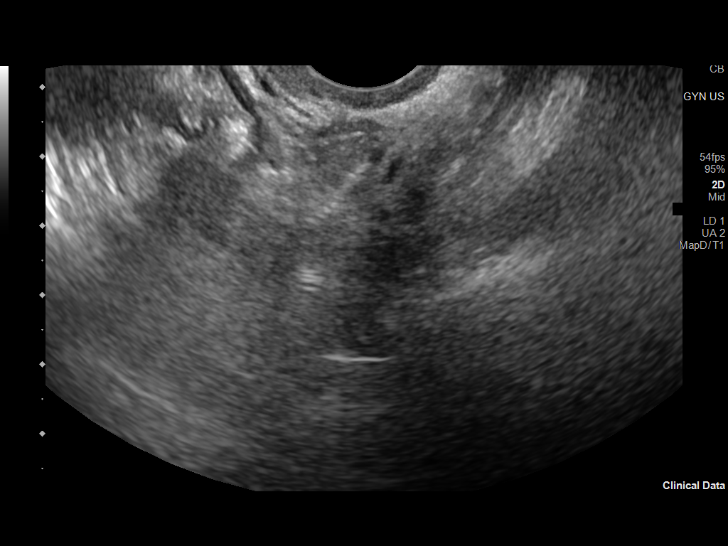
[im 87/91]
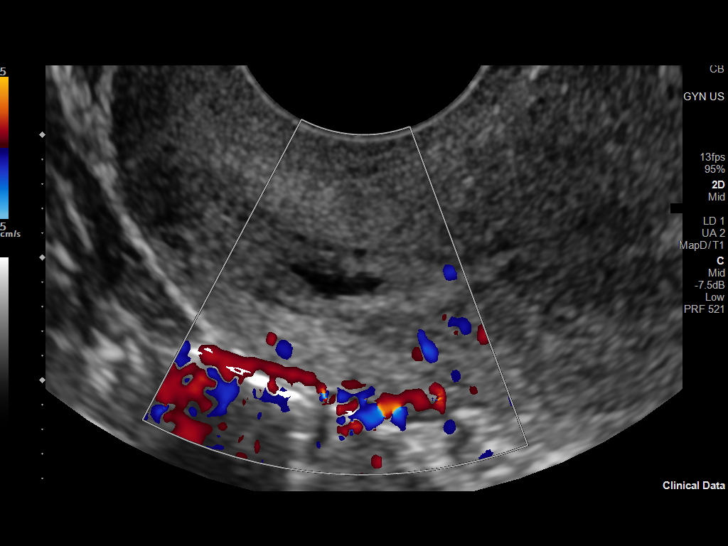

[13 of 28 positions shown; findings below may reference images not displayed]

FINDINGS: Intrauterine gestational sac: None.

Maternal uterus/adnexae: Endometrium is thickened up to 10 mm and
appears homogeneous (image 37). Retroverted uterus, encompassing
x 3.4 by 3.9 cm (volume 41 mL). In the lower uterine segment trace
fluid in the endometrial canal is identified (images 87 and 88).

The right ovary appears within normal limits measuring 2.2 x 3.1 x
2.0 cm (volume 7 mL). Occasional small follicles. The left ovary
appears normal measuring 3.5 x 1.4 by 2.5 cm (volume 7 mL) with
occasional small follicles.

No pelvic free fluid.
IMPRESSION: 1. No IUP, adnexal mass or pelvic free fluid. Trace simple appearing
fluid in the lower endometrial canal.

2. In this setting differential considerations include early IUP,
failed IUP, and less likely ectopic pregnancy.
Recommend serial quantitative beta HCG and repeat ultrasound as
necessary.
